# Patient Record
Sex: Male | Born: 1935 | Race: White | Hispanic: No | Marital: Married | Smoking: Former smoker
Health system: Southern US, Community
[De-identification: ages and names within clinical notes are randomized; demographics above are authoritative.]

## PROBLEM LIST (undated history)

## (undated) DIAGNOSIS — I639 Cerebral infarction, unspecified: Secondary | ICD-10-CM

## (undated) DIAGNOSIS — G300 Alzheimer's disease with early onset: Secondary | ICD-10-CM

## (undated) DIAGNOSIS — I10 Essential (primary) hypertension: Secondary | ICD-10-CM

## (undated) DIAGNOSIS — I2510 Atherosclerotic heart disease of native coronary artery without angina pectoris: Secondary | ICD-10-CM

## (undated) DIAGNOSIS — R972 Elevated prostate specific antigen [PSA]: Secondary | ICD-10-CM

## (undated) DIAGNOSIS — I4892 Unspecified atrial flutter: Secondary | ICD-10-CM

## (undated) DIAGNOSIS — F0280 Dementia in other diseases classified elsewhere without behavioral disturbance: Secondary | ICD-10-CM

## (undated) HISTORY — DX: Elevated prostate specific antigen (PSA): R97.20

## (undated) HISTORY — DX: Cerebral infarction, unspecified: I63.9

## (undated) HISTORY — PX: CORONARY ARTERY BYPASS GRAFT: SHX141

## (undated) HISTORY — DX: Essential (primary) hypertension: I10

## (undated) HISTORY — DX: Unspecified atrial flutter: I48.92

## (undated) HISTORY — DX: Atherosclerotic heart disease of native coronary artery without angina pectoris: I25.10

---

## 1988-03-05 HISTORY — PX: OTHER SURGICAL HISTORY: SHX169

## 2005-03-05 DIAGNOSIS — M109 Gout, unspecified: Secondary | ICD-10-CM | POA: Insufficient documentation

## 2008-03-01 DIAGNOSIS — I2510 Atherosclerotic heart disease of native coronary artery without angina pectoris: Secondary | ICD-10-CM | POA: Insufficient documentation

## 2008-03-01 HISTORY — DX: Atherosclerotic heart disease of native coronary artery without angina pectoris: I25.10

## 2008-03-30 DIAGNOSIS — R972 Elevated prostate specific antigen [PSA]: Secondary | ICD-10-CM

## 2008-03-30 HISTORY — DX: Elevated prostate specific antigen (PSA): R97.20

## 2009-10-31 ENCOUNTER — Ambulatory Visit: Payer: Self-pay | Admitting: Gastroenterology

## 2009-10-31 LAB — HM COLONOSCOPY

## 2013-05-18 ENCOUNTER — Emergency Department: Payer: Self-pay | Admitting: Emergency Medicine

## 2013-05-18 LAB — CBC WITH DIFFERENTIAL/PLATELET
BASOS ABS: 0.1 10*3/uL (ref 0.0–0.1)
Basophil %: 0.7 %
Eosinophil #: 0.1 10*3/uL (ref 0.0–0.7)
Eosinophil %: 0.8 %
HCT: 45 % (ref 40.0–52.0)
HGB: 14.7 g/dL (ref 13.0–18.0)
LYMPHS PCT: 12.6 %
Lymphocyte #: 1.4 10*3/uL (ref 1.0–3.6)
MCH: 29 pg (ref 26.0–34.0)
MCHC: 32.6 g/dL (ref 32.0–36.0)
MCV: 89 fL (ref 80–100)
MONO ABS: 0.7 x10 3/mm (ref 0.2–1.0)
Monocyte %: 6.7 %
NEUTROS PCT: 79.2 %
Neutrophil #: 8.6 10*3/uL — ABNORMAL HIGH (ref 1.4–6.5)
PLATELETS: 464 10*3/uL — AB (ref 150–440)
RBC: 5.05 10*6/uL (ref 4.40–5.90)
RDW: 14.1 % (ref 11.5–14.5)
WBC: 10.8 10*3/uL — AB (ref 3.8–10.6)

## 2013-05-18 LAB — COMPREHENSIVE METABOLIC PANEL
ALBUMIN: 3 g/dL — AB (ref 3.4–5.0)
ALT: 16 U/L (ref 12–78)
AST: 20 U/L (ref 15–37)
Alkaline Phosphatase: 145 U/L — ABNORMAL HIGH
Anion Gap: 2 — ABNORMAL LOW (ref 7–16)
BUN: 17 mg/dL (ref 7–18)
Bilirubin,Total: 0.4 mg/dL (ref 0.2–1.0)
CO2: 31 mmol/L (ref 21–32)
CREATININE: 1.05 mg/dL (ref 0.60–1.30)
Calcium, Total: 9.2 mg/dL (ref 8.5–10.1)
Chloride: 107 mmol/L (ref 98–107)
EGFR (Non-African Amer.): >60
GLUCOSE: 100 mg/dL — AB (ref 65–99)
Osmolality: 281 (ref 275–301)
POTASSIUM: 4.6 mmol/L (ref 3.5–5.1)
Sodium: 140 mmol/L (ref 136–145)
Total Protein: 7.5 g/dL (ref 6.4–8.2)

## 2013-10-05 ENCOUNTER — Observation Stay: Payer: Self-pay | Admitting: Internal Medicine

## 2013-10-05 LAB — CBC WITH DIFFERENTIAL/PLATELET
BASOS ABS: 0.1 10*3/uL (ref 0.0–0.1)
BASOS PCT: 0.6 %
Eosinophil #: 0 10*3/uL (ref 0.0–0.7)
Eosinophil %: 0.1 %
HCT: 51.4 % (ref 40.0–52.0)
HGB: 16.4 g/dL (ref 13.0–18.0)
LYMPHS ABS: 1.5 10*3/uL (ref 1.0–3.6)
LYMPHS PCT: 13.6 %
MCH: 28.8 pg (ref 26.0–34.0)
MCHC: 32 g/dL (ref 32.0–36.0)
MCV: 90 fL (ref 80–100)
MONOS PCT: 8.1 %
Monocyte #: 0.9 x10 3/mm (ref 0.2–1.0)
Neutrophil #: 8.4 10*3/uL — ABNORMAL HIGH (ref 1.4–6.5)
Neutrophil %: 77.6 %
Platelet: 245 10*3/uL (ref 150–440)
RBC: 5.71 10*6/uL (ref 4.40–5.90)
RDW: 14.8 % — ABNORMAL HIGH (ref 11.5–14.5)
WBC: 10.8 10*3/uL — ABNORMAL HIGH (ref 3.8–10.6)

## 2013-10-05 LAB — COMPREHENSIVE METABOLIC PANEL
ALK PHOS: 106 U/L
ALT: 23 U/L
Albumin: 3.1 g/dL — ABNORMAL LOW (ref 3.4–5.0)
Anion Gap: 11 (ref 7–16)
BILIRUBIN TOTAL: 0.9 mg/dL (ref 0.2–1.0)
BUN: 20 mg/dL — AB (ref 7–18)
CALCIUM: 8.8 mg/dL (ref 8.5–10.1)
Chloride: 102 mmol/L (ref 98–107)
Co2: 24 mmol/L (ref 21–32)
Creatinine: 1.13 mg/dL (ref 0.60–1.30)
EGFR (African American): >60
EGFR (Non-African Amer.): >60
Glucose: 185 mg/dL — ABNORMAL HIGH (ref 65–99)
Osmolality: 281 (ref 275–301)
POTASSIUM: 3.9 mmol/L (ref 3.5–5.1)
SGOT(AST): 16 U/L (ref 15–37)
Sodium: 137 mmol/L (ref 136–145)
TOTAL PROTEIN: 7.1 g/dL (ref 6.4–8.2)

## 2013-10-05 LAB — TSH: Thyroid Stimulating Horm: 2.39 u[IU]/mL

## 2013-10-05 LAB — TROPONIN I: Troponin-I: <0.02 ng/mL

## 2013-10-06 ENCOUNTER — Ambulatory Visit: Payer: Self-pay | Admitting: Neurology

## 2013-10-06 LAB — URINALYSIS, COMPLETE
Bilirubin,UR: NEGATIVE
Glucose,UR: 50 mg/dL (ref 0–75)
Granular Cast: 6
Hyaline Cast: 5
KETONE: NEGATIVE
Leukocyte Esterase: NEGATIVE
Nitrite: NEGATIVE
PH: 5 (ref 4.5–8.0)
Protein: >=500
Specific Gravity: 1.026 (ref 1.003–1.030)

## 2013-10-06 LAB — LIPID PANEL
CHOLESTEROL: 190 mg/dL (ref 0–200)
HDL: 48 mg/dL (ref 40–60)
LDL CHOLESTEROL, CALC: 125 mg/dL — AB (ref 0–100)
TRIGLYCERIDES: 85 mg/dL (ref 0–200)
VLDL Cholesterol, Calc: 17 mg/dL (ref 5–40)

## 2013-10-06 LAB — AMMONIA: Ammonia, Plasma: 25 mcmol/L (ref 11–32)

## 2013-10-07 DIAGNOSIS — I639 Cerebral infarction, unspecified: Secondary | ICD-10-CM

## 2013-10-07 HISTORY — DX: Cerebral infarction, unspecified: I63.9

## 2013-11-02 ENCOUNTER — Encounter: Payer: Self-pay | Admitting: Family Medicine

## 2013-11-03 ENCOUNTER — Encounter: Payer: Self-pay | Admitting: Family Medicine

## 2013-12-03 ENCOUNTER — Encounter: Payer: Self-pay | Admitting: Family Medicine

## 2014-02-04 LAB — BASIC METABOLIC PANEL
BUN: 28 mg/dL — AB (ref 4–21)
CREATININE: 1.2 mg/dL (ref <=1.3)
GLUCOSE: 93 mg/dL
POTASSIUM: 5 mmol/L (ref 3.4–5.3)
SODIUM: 145 mmol/L (ref 137–147)

## 2014-06-09 DIAGNOSIS — I4892 Unspecified atrial flutter: Secondary | ICD-10-CM | POA: Diagnosis not present

## 2014-06-09 DIAGNOSIS — E780 Pure hypercholesterolemia: Secondary | ICD-10-CM | POA: Diagnosis not present

## 2014-06-09 DIAGNOSIS — Z1389 Encounter for screening for other disorder: Secondary | ICD-10-CM | POA: Diagnosis not present

## 2014-06-09 DIAGNOSIS — J440 Chronic obstructive pulmonary disease with acute lower respiratory infection: Secondary | ICD-10-CM | POA: Diagnosis not present

## 2014-06-10 DIAGNOSIS — E780 Pure hypercholesterolemia: Secondary | ICD-10-CM | POA: Diagnosis not present

## 2014-06-10 LAB — HEPATIC FUNCTION PANEL: ALT: 17 U/L (ref 10–40)

## 2014-06-10 LAB — LIPID PANEL
Cholesterol: 135 mg/dL (ref 0–200)
HDL: 44 mg/dL (ref 35–70)
LDL Cholesterol: 73 mg/dL
Triglycerides: 89 mg/dL (ref 40–160)

## 2014-06-26 NOTE — Consult Note (Signed)
PATIENT NAME:  Ryan Villanueva, Ryan Villanueva MR#:  [id number] DATE OF BIRTH:  1930/05/05  DATE OF CONSULTATION:  10/06/2013  REFERRING PHYSICIAN:   CONSULTING PHYSICIAN:  Pauletta BrownsYuriy Glenette Bookwalter, MD  REASON FOR CONSULTATION: Speech abnormalities.   HISTORY OF PRESENT ILLNESS: This is an 81 year old gentleman with past medical history of hypertension, hyperlipidemia, coronary artery disease, history of CABG. apparently, as per family, admitted with speech disturbance. The patient's family has described the patient's speech as being gibberish.  At the time of admission, the patient did not have any complaints, stated he had right hand swelling. Denies any trauma. Currently states he is back to baseline. The patient's brother is at bedside and he believes patient's speech is at baseline.   REVIEW OF SYSTEMS:  Denies fever and chills. Denies any blurry vision, double vision. Denies tinnitus, ear pain. Denies chest pain, palpitations. Denies nausea, vomiting. Denies dysuria, hematuria. Denies, I am sure, thyroid problems. Denies easy bruising or bleeding. Positive for right hand swelling as above. Denies any paralysis. Questionable slurry speech.   PAST MEDICAL HISTORY: Hypertension, hyperlipidemia, and history of CABG,   SOCIAL HISTORY: Social remote tobacco use, positive for EtOH use.   FAMILY HISTORY: Positive history of strokes and cardiovascular disease.   ALLERGIES: NO KNOWN DRUG ALLERGIES.   LABORATORY DATA: Work-up has been reviewed. Imaging has been reviewed. The patient has remote infarcts in the left occipital and left thalamic regions that are chronic, likely contributing to his dysarthria that is at baseline, a slight right upper extremity weakness.   NEUROLOGIC EXAMINATION:  The patient's temperature is 98, pulse 57, respirations 18, blood pressure 172/87. The patient is awake to his name, tells me he is in the hospital, could not tell me the date and time. Facial sensation intact. Facial motor is intact. On  motor examination, slight weakness on the right upper and right lower extremity is likely from the chronic stroke. The patient's speech is dysarthric but, as per brother and the patient, that is his baseline. Coordination: Finger-to-nose intact, diminished on the right side. Sensation intact bilateral. Reflexes diminished.   IMPRESSION: An 81 year old male with hypertension, hyperlipidemia, and coronary artery disease as per family, presents with speech disturbances. Currently suspected that speech is at baseline on examination. Speaking to the patient, I believe there are a lot of confabulations that are present. Imaging has been reviewed.   PLAN: I would not recommend any further imaging at this point. His ultrasound on the MRI has been reviewed. There is a possibility of EtOH being component to his current problems because of his confabulations. He appears to be agitated, slightly tremulous. I agree with daily thiamine and folate, antiplatelet aspirin as the patient was not on it at home. Would be okay with discharging him from a neurological standpoint today. This case was discussed with the patient, patient's brother, and nursing staff.   Thank you, it was a pleasure seeing this patient.    ____________________________ Pauletta BrownsYuriy Labarron Durnin, MD yz:ts D: 10/06/2013 14:00:33 ET T: 10/06/2013 14:25:27 ET JOB#: [id number]  cc: Pauletta BrownsYuriy Tejah Brekke, MD, <Dictator> Pauletta BrownsYURIY Sharryn Belding MD ELECTRONICALLY SIGNED 10/06/2013 21:17

## 2014-06-26 NOTE — Discharge Summary (Signed)
PATIENT NAME:  Ryan Villanueva, Ryan Villanueva MR#:  [id number] DATE OF BIRTH:  Feb 14, 1931  DATE OF ADMISSION:  10/05/2013 DATE OF DISCHARGE:  10/06/2013  DISCHARGE DIAGNOSES:  1.  Vascular dementia.  2.  Alcohol abuse.  3.  Hypertension.  4.  Hyperlipidemia.  5.  Noncompliance.  6.  History of cerebrovascular accident.   DISCHARGE MEDICATIONS:  1.  Atorvastatin 20 mg oral daily.  2.  Aspirin 81 mg daily.  3.  Hydrochlorothiazide/lisinopril 25/20 one tablet daily.  4.  Thiamine 100 mg daily.  5.  Ibuprofen 600 mg oral 3 times a day.   IMAGING STUDIES:  1.  Include a CT scan of the head without contrast showed no acute abnormalities, mild-to-moderate atrophy and mild chronic small vessel white matter ischemic changes.  2.  Chest x-ray, portable, showed nothing acute, mild atelectasis in the left lower lobe.  3.  MRI of the brain without contrast showed mild global atrophy without hydrocephalus, tiny areas of blood breakdown products left occipital lobe and left thalamus consistent with old prior hemorrhagic ischemia.  4.  Mild-to-moderate severe small vessel ischemic changes.  5.  Carotid Doppler showed no significant stenosis.  6.  Echocardiogram showed EF of 50% to 55% with moderately dilated left ventricle and left atrium, nothing acute, no thrombus.   ADMITTING HISTORY AND PHYSICAL AND HOSPITAL COURSE: Please see detailed H and P dictated by Dr. Clint GuyHower. In brief, an 81 year old male patient with history of alcohol abuse and hypertension brought into the hospital complaining of slurred speech. The patient had the slow speech going on for 2 months. He was placed on telemetry floor, got an MRI of the brain which showed no acute strokes, did show old hemorrhagic infarcts. He also had carotid Doppler and echocardiogram which showed nothing acute. He was seen by neurology who thought the patient's symptoms were likely secondary to the alcohol and he was given high-dose thiamine 5 mg IV dose in the hospital. He  is being started on thiamine at home. He has been set up with home health PT, OT, and speech therapy. The patient does have progressively worsening vascular dementia.   Prior to discharge, the patient's motor strength is 5/5 in upper and lower extremities, does not have any slurred speech, has confabulation. He does have dementia.   DISCHARGE INSTRUCTIONS: Low-sodium, low-fat diet. Activity as tolerated. Follow up with primary care physician and neurology in a week.   ____________________________ Molinda BailiffSrikar R. Faren Florence, MD srs:tm D: 10/08/2013 12:46:49 ET T: 10/08/2013 14:47:14 ET JOB#: [id number]  cc: Wardell HeathSrikar R. Kaiyon Hynes, MD, <Dictator> Orie FishermanSRIKAR R Jaysten Essner MD ELECTRONICALLY SIGNED 10/19/2013 14:09

## 2014-06-26 NOTE — H&P (Signed)
PATIENT NAME:  Ryan Villanueva, Ryan Villanueva MR#:  [id number] DATE OF BIRTH:  May 10, 1930  DATE OF ADMISSION:  10/05/2013  REFERRING PHYSICIAN: Dr. Darnelle Catalan.  PRIMARY CARE PHYSICIAN: Dr. Sherrie Mustache.   CHIEF COMPLAINT: Speech problems.   HISTORY OF PRESENT ILLNESS: The patient is an 81 year old Caucasian gentleman with history of hypertension, hyperlipidemia, coronary artery disease status post CABG, as well as medical noncompliance, presenting with speech problems. Family noticed onset of problems at 7:00 p.m. on 10/05/2013. He had slurred speech as well as associated confusion and speaking gibberish. The patient denies any complaints; however, he is currently confused. His only complaint is of right hand swelling, though he has no further information about this. Denies any trauma, but states that he has swelling and redness of his right hand, and it is difficult to close his hand, though denies any further symptoms. Denies any actual pain in that extremity.   REVIEW OF SYSTEMS: Question the validity of these statements, as the patient is currently confused, however: CONSTITUTIONAL: Denies fevers, chills, weakness.  EYES: Denies blurry vision, double vision or eye pain.  EARS, NOSE, THROAT: Denies tinnitus, ear pain, hearing loss.  RESPIRATORY: Denies cough, wheeze, shortness of breath.  CARDIOVASCULAR: Denies chest pain, palpitations, edema. GASTROINTESTINAL: Denies nausea, vomiting, diarrhea, abdominal pain.  GENITOURINARY: Denies dysuria, hematuria.  ENDOCRINE: Denies nocturia or thyroid problems.  HEMATOLOGIC AND LYMPHATIC: Denies easy bruising or bleeding.  SKIN: Denies rashes or lesions.  MUSCULOSKELETAL: Positive for right hand swelling as described above. Otherwise, denies any neck pain, back pain, shoulder pain, knee pain, hip pain, or any further arthritic symptoms.  NEUROLOGIC: Denies any paralysis, paresthesias.  PSYCHIATRIC: Denies anxiety or depressive symptoms.  Otherwise, full review of systems  performed by me is negative; however, once again, the family describes slurred speech.   PAST MEDICAL HISTORY: Hypertension, hyperlipidemia, coronary artery disease status post CABG.   SOCIAL HISTORY: Remote tobacco use. Positive for alcohol use. Family states he is an every day alcohol user. Denies any drug use.   FAMILY HISTORY: Positive for cancers. However, denies any stroke or cardiovascular problems.   ALLERGIES: No known drug allergies.   HOME MEDICATIONS: He has an incomplete medication list. His wife did bring in two medication lists, though medication list is incomplete. He states that is not taking any of his medications, does have tramadol 50 mg p.o. q.8 hours as needed for pain listed, and varying blood pressure medications, though the family is not actually sure which ones he is taking.   PHYSICAL EXAMINATION: VITAL SIGNS: Temperature 98.5, heart rate 85, respirations 20, blood pressure 221/125, saturating 94% on room air.  GENERAL: Well-nourished, well-developed Caucasian gentleman, currently in no acute distress.  HEAD: Normocephalic, atraumatic.  EYES: Pupils equal, round, reactive to light. Extraocular movements intact. No scleral icterus.  MOUTH: Moist mucosal membrane. Dentition poor. No abscess noted.  EARS, NOSE, AND THROAT: Clear, without exudates. No external lesions.  NECK: Supple. No thyromegaly. No nodules. No JVD.  PULMONARY: Clear to auscultation bilaterally without wheezes, rubs or rhonchi. No use of accessory muscles. Good respiratory effort.  CHEST: Nontender to palpation.  CARDIOVASCULAR: S1, S2. Irregular rate, irregular rhythm. No murmurs, rubs, or gallops. No edema. Pedal pulses 2+ bilaterally.  GASTROINTESTINAL: Soft, nontender, nondistended. No masses. Good bowel sounds. No hepatosplenomegaly.  MUSCULOSKELETAL: Dorsum of right hand with erythema as well as edema, warm to touch. However, nontender to palpation. Otherwise, no further swelling, clubbing, or  edema. Range of motion full in all extremities.  NEUROLOGIC: Cranial nerves  II through XII intact. No gross focal neurological deficits. Strength 5/5 in all extremities, including proximal and distal flexion and extension. Sensation intact. Reflexes intact. Pronator drift within normal limits. However, speech is altered, as described below.  PSYCHIATRIC: Mood and affect blunted. The patient is awake, alert, oriented to self only. He is unaware of his current location as well as the date. His speech is somewhat garbled. However, this is compounded by he is hard of hearing. He currently has no slurred speech, though does have some word-finding difficulty. Insight and judgment appear to be poor.   LABORATORY DATA: EKG reveals atrial fibrillation with right bundle branch block. CT head performed, reveals no acute intracranial process. Chest x-ray performed, reveals no acute cardiopulmonary process. X-ray of the right wrist reveals old avulsion of the ulnar styloid with remodeling, no acute fractures noted. Sodium 137, potassium 3.9, chloride 102, bicarbonate 24, BUN 20, creatinine 1.13, glucose 185. LFTs: Albumin 3.1. Troponin 0.02. WBC 10.8, hemoglobin 16.4, platelets of 245.   ASSESSMENT AND PLAN: An 81 year old Caucasian gentleman with history of hyperlipidemia and coronary artery disease, presenting with speech problems.  1.  Cerebrovascular accident symptoms still present. Will initiate aspirin and statin therapy, place on telemetry, and check MRI, as well as carotid Dopplers and lipids. Do neurologic checks every 4 hours, allow permissive hypertension, treating only blood pressure greater than 220/120. At that time, hydralazine 10 mg intravenous.  2.  Hypertension. The patient is not sure of his medications, and once again, is not actually taking any of these medications. Will initiate as-needed hydralazine, as stated above, with permissive hypertension in the setting of possible stroke.  3.   Hyperlipidemia. Initiate statin therapy.  4.  Coronary artery disease, status post coronary artery bypass grafting. Initiate aspirin and statin therapy.  5.  Right hand pain. Provide supportive measures. There is no acute fracture on x-ray. Rest, ice, elevation.  6.  Alcohol abuse, heavy, per family. Will initiate CIWA protocol.  7.  Deep venous thrombosis prophylaxis with sequential compression devices.   CODE STATUS: The patient is full code.   TIME SPENT: 45 minutes.   ____________________________ Cletis Athensavid K. Renwick Asman, MD dkh:cg D: 10/05/2013 23:29:03 ET T: 10/06/2013 00:23:22 ET JOB#: [id number]  cc: Cletis Athensavid K. Amay Mijangos, MD, <Dictator> Ilai Hiller Synetta ShadowK Desirae Mancusi MD ELECTRONICALLY SIGNED 10/07/2013 0:17

## 2014-07-27 DIAGNOSIS — F0390 Unspecified dementia without behavioral disturbance: Secondary | ICD-10-CM | POA: Diagnosis not present

## 2014-07-27 DIAGNOSIS — I459 Conduction disorder, unspecified: Secondary | ICD-10-CM | POA: Diagnosis not present

## 2014-07-27 DIAGNOSIS — I4892 Unspecified atrial flutter: Secondary | ICD-10-CM | POA: Diagnosis not present

## 2014-07-27 DIAGNOSIS — R001 Bradycardia, unspecified: Secondary | ICD-10-CM | POA: Diagnosis not present

## 2014-11-04 DIAGNOSIS — I4892 Unspecified atrial flutter: Secondary | ICD-10-CM | POA: Insufficient documentation

## 2014-11-04 DIAGNOSIS — F0390 Unspecified dementia without behavioral disturbance: Secondary | ICD-10-CM

## 2014-11-04 DIAGNOSIS — R479 Unspecified speech disturbances: Secondary | ICD-10-CM | POA: Insufficient documentation

## 2014-11-04 DIAGNOSIS — I459 Conduction disorder, unspecified: Secondary | ICD-10-CM | POA: Insufficient documentation

## 2014-11-04 DIAGNOSIS — F0150 Vascular dementia without behavioral disturbance: Secondary | ICD-10-CM | POA: Insufficient documentation

## 2014-11-04 DIAGNOSIS — I639 Cerebral infarction, unspecified: Secondary | ICD-10-CM | POA: Insufficient documentation

## 2014-11-04 DIAGNOSIS — M25539 Pain in unspecified wrist: Secondary | ICD-10-CM | POA: Insufficient documentation

## 2014-11-04 DIAGNOSIS — R001 Bradycardia, unspecified: Secondary | ICD-10-CM | POA: Insufficient documentation

## 2014-11-10 ENCOUNTER — Encounter: Payer: Self-pay | Admitting: Family Medicine

## 2014-11-17 ENCOUNTER — Encounter: Payer: Self-pay | Admitting: Family Medicine

## 2014-11-25 ENCOUNTER — Encounter: Payer: Self-pay | Admitting: Family Medicine

## 2014-11-25 ENCOUNTER — Ambulatory Visit (INDEPENDENT_AMBULATORY_CARE_PROVIDER_SITE_OTHER): Payer: Commercial Managed Care - HMO | Admitting: Family Medicine

## 2014-11-25 VITALS — BP 140/64 | HR 41 | Temp 97.9°F | Resp 16 | Ht 68.0 in | Wt 188.0 lb

## 2014-11-25 DIAGNOSIS — I483 Typical atrial flutter: Secondary | ICD-10-CM

## 2014-11-25 DIAGNOSIS — F0390 Unspecified dementia without behavioral disturbance: Secondary | ICD-10-CM

## 2014-11-25 DIAGNOSIS — E7800 Pure hypercholesterolemia, unspecified: Secondary | ICD-10-CM

## 2014-11-25 DIAGNOSIS — F0150 Vascular dementia without behavioral disturbance: Secondary | ICD-10-CM | POA: Diagnosis not present

## 2014-11-25 DIAGNOSIS — I6990 Unspecified sequelae of unspecified cerebrovascular disease: Secondary | ICD-10-CM

## 2014-11-25 DIAGNOSIS — I2510 Atherosclerotic heart disease of native coronary artery without angina pectoris: Secondary | ICD-10-CM

## 2014-11-25 DIAGNOSIS — Z23 Encounter for immunization: Secondary | ICD-10-CM

## 2014-11-25 DIAGNOSIS — Z0000 Encounter for general adult medical examination without abnormal findings: Secondary | ICD-10-CM

## 2014-11-25 DIAGNOSIS — F0391 Unspecified dementia with behavioral disturbance: Secondary | ICD-10-CM | POA: Diagnosis not present

## 2014-11-25 DIAGNOSIS — Z951 Presence of aortocoronary bypass graft: Secondary | ICD-10-CM | POA: Insufficient documentation

## 2014-11-25 MED ORDER — RIVAROXABAN 20 MG PO TABS: 20.0000 mg | ORAL_TABLET | Freq: Every day | ORAL | Status: DC

## 2014-11-25 NOTE — Progress Notes (Signed)
Patient: Ryan Villanueva, Male    DOB: [birth year], 81 y.o.   MRN: [id number] Visit Date: 11/25/2014  Today's Provider: Mila Merry, MD   Chief Complaint  Patient presents with  . Medicare Wellness  . Hyperlipidemia  . Hypertension  . Atrial Flutter   Subjective:    Physical  Ryan Villanueva is a 81 y.o. male. He feels well. He reports exercising yes. He reports he is sleeping well.  -----------------------------------------------------------   Atrial Flutter Follow-up for atrial flutter from 06/09/2014. Was referred to Dr. Lady Gary for follow up. Since then prescribed started xarelto 15 mg qd and advised to stop taking daily aspirin. However he has no been taking Xarelto. He denies any chest pain, palpitations, or shortness of breath.    Hypertension, follow-up:  BP Readings from Last 3 Encounters:  11/25/14 140/64  07/27/14 118/58    He was last seen for hypertension 5 months ago.  BP at that visit was 148/54. Management since that visit includes none .He reports good compliance with treatment. He is not having side effects. none  He is exercising. He is adherent to low salt diet.   Outside blood pressures are not being checked.  ---------------------------------------------------------------------    Lipid/Cholesterol, Follow-up:   Last seen for this 5 months ago.  Management since that visit includes none.  Last Lipid Panel:    Component Value Date/Time   CHOL 135 06/10/2014   CHOL 190 10/06/2013 0429   TRIG 89 06/10/2014   TRIG 85 10/06/2013 0429   HDL 44 06/10/2014   HDL 48 10/06/2013 0429   VLDL 17 10/06/2013 0429   LDLCALC 73 06/10/2014   LDLCALC 125* 10/06/2013 0429    He reports good compliance with treatment. He is not having side effects. none  Wt Readings from Last 3 Encounters:  11/25/14 188 lb (85.276 kg)  07/27/14 187 lb (84.823 kg)    ----------------------------------------------------------------------   Dementia: He  continues donepezil prescribed by Dr. Sherryll Burger. He states he is tolerating medication well, but is unable to state if it has been beneficial. There are no family members present today to express their opinion.     Review of Systems  Constitutional: Negative.  Negative for fever, chills, appetite change and fatigue.  HENT: Negative.  Negative for congestion, ear pain, hearing loss, nosebleeds and trouble swallowing.   Eyes: Negative.  Negative for pain and visual disturbance.  Respiratory: Negative.  Negative for cough, chest tightness and shortness of breath.   Cardiovascular: Negative.  Negative for chest pain, palpitations and leg swelling.  Gastrointestinal: Negative.  Negative for nausea, vomiting, abdominal pain, diarrhea, constipation and blood in stool.  Endocrine: Negative.  Negative for polydipsia, polyphagia and polyuria.  Genitourinary: Negative.  Negative for dysuria and flank pain.  Musculoskeletal: Negative.  Negative for myalgias, back pain, joint swelling, arthralgias and neck stiffness.  Skin: Negative.  Negative for color change, rash and wound.  Allergic/Immunologic: Negative.   Neurological: Negative.  Negative for dizziness, tremors, seizures, speech difficulty, weakness, light-headedness and headaches.  Hematological: Negative.   Psychiatric/Behavioral: Positive for confusion. Negative for behavioral problems, sleep disturbance, dysphoric mood and decreased concentration. The patient is not nervous/anxious.     Social History   Social History  . Marital Status: Married    Spouse Name: N/A  . Number of Children: N/A  . Years of Education: N/A   Occupational History  . Not on file.   Social History Main Topics  . Smoking status: Former  Smoker  . Smokeless tobacco: Not on file     Comment: quit smoking in the 1990's uses smokeless tobacco  . Alcohol Use: No  . Drug Use: Not on file  . Sexual Activity: Not on file   Other Topics Concern  . Not on file   Social  History Narrative    Patient Active Problem List   Diagnosis Date Noted  . H/O coronary artery bypass surgery 11/25/2014  . Late effects of CVA (cerebrovascular accident) 11/25/2014  . Atrial flutter 11/04/2014  . Bradycardia 11/04/2014  . Cerebral infarct 11/04/2014  . 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 HB (heart block) 11/04/2014  . Speech disorder 11/04/2014  . CAD in native artery 03/01/2008  . Arthritis due to gout 03/05/2005  . Essential (primary) hypertension 03/05/1988  . Hypercholesteremia 03/05/1988    Past Surgical History  Procedure Laterality Date  . History of coronary artery bypass graft  1990    x3 Surgeon [facility]    His family history is not on file.    Previous Medications   AMLODIPINE (NORVASC) 10 MG TABLET    Take 1 tablet by mouth daily.   ATORVASTATIN (LIPITOR) 40 MG TABLET    Take 1 tablet by mouth daily.   DONEPEZIL (ARICEPT) 5 MG TABLET    Take 1 tablet by mouth at bedtime.   LISINOPRIL-HYDROCHLOROTHIAZIDE (PRINZIDE,ZESTORETIC) 20-25 MG PER TABLET    Take 1 tablet by mouth daily.   MULTIPLE VITAMIN PO    Take 1 tablet by mouth daily.   RIVAROXABAN (XARELTO) 20 MG TABS TABLET    Take 20 mg by mouth daily.   VITAMIN B-12 (CYANOCOBALAMIN) 1000 MCG TABLET    Take 1 tablet by mouth daily.    Patient Care Team: Malva Limes, MD as PCP - General (Family Medicine) Lonell Face, MD (Neurology) Dalia Heading, MD (Cardiology) Lonell Face, MD (Neurology)     Objective:   Vitals: BP 140/64 mmHg  Pulse 41  Temp(Src) 97.9 F (36.6 C) (Oral)  Resp 16  Ht  (1.727 m)  Wt 188 lb (85.276 kg)  BMI 28.59 kg/m2  SpO2 96%  Physical Exam   General Appearance:    Alert, cooperative, no distress, appears stated age  Head:    Normocephalic, without obvious abnormality, atraumatic  Eyes:    PERRL, conjunctiva/corneas clear, EOM's intact, fundi    benign, both eyes       Ears:    Normal TM's and external ear canals, both ears   Nose:   Nares normal, septum midline, mucosa normal, no drainage   or sinus tenderness  Throat:   Lips, mucosa, and tongue normal; teeth and gums normal  Neck:   Supple, symmetrical, trachea midline, no adenopathy;       thyroid:  No enlargement/tenderness/nodules; no carotid   bruit or JVD  Back:     Symmetric, no curvature, ROM normal, no CVA tenderness  Lungs:     Clear to auscultation bilaterally, respirations unlabored  Chest wall:    No tenderness or deformity  Heart:    Bradycardic, Regular rhythm, S1 and S2 normal,  rub   or gallop. II/VY systolic murmur  Abdomen:     Soft, non-tender, bowel sounds active all four quadrants,    no masses, no organomegaly  Genitalia:    deferred  Rectal:    deferred  Extremities:   Extremities normal, atraumatic, no cyanosis or edema  Pulses:   2+ and symmetric all  extremities  Skin:   Skin color, texture, turgor normal, no rashes or lesions  Lymph nodes:   Cervical, supraclavicular, and axillary nodes normal  Neurologic:   CNII-XII intact. Normal strength, sensation and reflexes      Throughout. Awake and alert. Oriented to person only.     Activities of Daily Living In your present state of health, do you have any difficulty performing the following activities: 11/25/2014  Hearing? Y  Vision? N  Difficulty concentrating or making decisions? N  Walking or climbing stairs? Y  Dressing or bathing? N  Doing errands, shopping? N    Fall Risk Assessment Fall Risk  11/25/2014  Falls in the past year? No     Depression Screen PHQ 2/9 Scores 11/25/2014  PHQ - 2 Score 0  PHQ- 9 Score 0    Cognitive Testing - 6-CIT  Correct? Score   What year is it? yes 4 0 or 4  What month is it? yes 4 0 or 3  Memorize:    Floyde Parkins,  44,  [address]  [place],      What time is it? (within 1 hour) yes 0 0 or 3  Count backwards from 20 yes 0 0, 2, or 4  Name the months of the year yes 4 0, 2, or 4  Repeat name & address above yes 10 0, 2, 4, 6, 8,  or 10       TOTAL SCORE  23/28   Interpretation:  Abnormal-    Normal (0-7) Abnormal (8-28)    Assessment & Plan:     Annual Wellness Visit  Reviewed patient's Family Medical History Reviewed and updated list of patient's medical providers Assessment of cognitive impairment was done Assessed patient's functional ability Established a written schedule for health screening services Health Risk Assessent Completed and Reviewed  Exercise Activities and Dietary recommendations Goals    None      Immunization History  Administered Date(s) Administered  . Pneumococcal Conjugate-13 10/14/2013  . Pneumococcal Polysaccharide-23 05/13/2002    Health Maintenance  Topic Date Due  . TETANUS/TDAP  08/04/1951  . ZOSTAVAX  08/03/1992  . INFLUENZA VACCINE  10/04/2014  . COLONOSCOPY  11/01/2019  . PNA vac Low Risk Adult  Completed      Discussed health benefits of physical activity, and encouraged him to engage in regular exercise appropriate for his age and condition.    ------------------------------------------------------------------------------------------------------------ 1. Routine adult health maintenance  - EKG 12-Lead  2. Typical atrial flutter Not compliant with Xarelto. Advised to stop aspirin and start Xarelto - rivaroxaban (XARELTO) 20 MG TABS tablet; Take 1 tablet (20 mg total) by mouth daily.  Dispense: 30 tablet; Refill: 5  3. Hypercholesteremia He is tolerating atorvastatin well with no adverse effects.    4. CAD in native artery Asymptomatic. Compliant with medication.  Continue aggressive risk factor modification.   5. Late effects of CVA (cerebrovascular accident) Stable  6. Need for influenza vaccination  - Flu vaccine HIGH DOSE PF  7. Mixed vascular and neurodegenerative dementia, without behavioral disturbance Continue donepezil

## 2014-11-25 NOTE — Patient Instructions (Signed)
Stop taking Aspirin as soon as you start taking Xarelto

## 2014-11-27 ENCOUNTER — Other Ambulatory Visit: Payer: Self-pay | Admitting: Family Medicine

## 2014-11-30 ENCOUNTER — Other Ambulatory Visit: Payer: Self-pay | Admitting: *Deleted

## 2014-11-30 DIAGNOSIS — I483 Typical atrial flutter: Secondary | ICD-10-CM

## 2014-11-30 MED ORDER — ATORVASTATIN CALCIUM 40 MG PO TABS: 40.0000 mg | ORAL_TABLET | Freq: Every day | ORAL | Status: DC

## 2014-11-30 MED ORDER — RIVAROXABAN 20 MG PO TABS
20.0000 mg | ORAL_TABLET | Freq: Every day | ORAL | Status: DC
Start: 2014-11-30 — End: 2014-12-15

## 2014-12-15 ENCOUNTER — Other Ambulatory Visit: Payer: Self-pay | Admitting: Family Medicine

## 2014-12-15 DIAGNOSIS — I483 Typical atrial flutter: Secondary | ICD-10-CM

## 2014-12-15 MED ORDER — RIVAROXABAN 20 MG PO TABS: 20.0000 mg | ORAL_TABLET | Freq: Every day | ORAL | Status: DC

## 2014-12-15 MED ORDER — ATORVASTATIN CALCIUM 40 MG PO TABS: 40.0000 mg | ORAL_TABLET | Freq: Every day | ORAL | Status: AC

## 2015-03-28 ENCOUNTER — Encounter: Payer: Self-pay | Admitting: Family Medicine

## 2015-03-28 ENCOUNTER — Ambulatory Visit (INDEPENDENT_AMBULATORY_CARE_PROVIDER_SITE_OTHER): Payer: Commercial Managed Care - HMO | Admitting: Family Medicine

## 2015-03-28 VITALS — BP 142/62 | HR 60 | Temp 97.9°F | Resp 16 | Ht 68.0 in | Wt 181.0 lb

## 2015-03-28 DIAGNOSIS — I2510 Atherosclerotic heart disease of native coronary artery without angina pectoris: Secondary | ICD-10-CM | POA: Diagnosis not present

## 2015-03-28 DIAGNOSIS — E7800 Pure hypercholesterolemia, unspecified: Secondary | ICD-10-CM

## 2015-03-28 DIAGNOSIS — E649 Sequelae of unspecified nutritional deficiency: Secondary | ICD-10-CM | POA: Diagnosis not present

## 2015-03-28 DIAGNOSIS — I10 Essential (primary) hypertension: Secondary | ICD-10-CM | POA: Diagnosis not present

## 2015-03-28 DIAGNOSIS — R5383 Other fatigue: Secondary | ICD-10-CM

## 2015-03-28 DIAGNOSIS — I483 Typical atrial flutter: Secondary | ICD-10-CM | POA: Diagnosis not present

## 2015-03-28 DIAGNOSIS — I6990 Unspecified sequelae of unspecified cerebrovascular disease: Secondary | ICD-10-CM | POA: Diagnosis not present

## 2015-03-28 DIAGNOSIS — D649 Anemia, unspecified: Secondary | ICD-10-CM | POA: Diagnosis not present

## 2015-03-28 NOTE — Progress Notes (Signed)
Patient ID: Ryan Villanueva, male   DOB: Aug 22, 1930, 82 y.o.   MRN: [id number]       Patient: Ryan Villanueva Male    DOB: 01-Dec-1930   82 y.o.   MRN: [id number] Visit Date: 03/28/2015  Today's Provider: Mila Merry, MD   Chief Complaint  Patient presents with  . Coronary Artery Disease  . Atrial Flutter  . Hyperlipidemia   Subjective:    HPI  Hypertension, follow-up:  BP Readings from Last 3 Encounters:  03/28/15 142/62  11/25/14 140/64  07/27/14 118/58    He was last seen for hypertension 4 months ago.  BP at that visit was 140/64. Management since that visit includes no changes. He reports good compliance with treatment. He is not having side effects.  He is not exercising. Outside blood pressures are not being checked. He is experiencing none.  Patient denies chest pain.   Weight trend: stable Wt Readings from Last 3 Encounters:  03/28/15 181 lb (82.101 kg)  11/25/14 188 lb (85.276 kg)  07/27/14 187 lb (84.823 kg)    Current diet: well balanced  Atrial Flutter-Follow up Patient also comes in today to follow up on Atrial Flutter. Since last visit, patient was advised to D/C Aspirin and start Xarelto . Patient reports that he is tolerating medication well with no side effects.   Coronary Artery Disease Last follow up was on 11/25/14 and no changes were made in medications.   Patient's wife states he sleep a lot. He gets up about 3 times each night to urinate, but otherwise sleeps well at night, and does not snore loudly or having any breathing abnormalities when he sleeps.      No Known Allergies Previous Medications   AMLODIPINE (NORVASC) 10 MG TABLET    TAKE 1 TABLET EVERY DAY   ATORVASTATIN (LIPITOR) 40 MG TABLET    Take 1 tablet (40 mg total) by mouth daily.   DONEPEZIL (ARICEPT) 5 MG TABLET    Take 1 tablet by mouth at bedtime.   LISINOPRIL-HYDROCHLOROTHIAZIDE (PRINZIDE,ZESTORETIC) 20-25 MG PER TABLET    TAKE 1 TABLET EVERY DAY   MULTIPLE VITAMIN PO     Take 1 tablet by mouth daily.   RIVAROXABAN (XARELTO) 20 MG TABS TABLET    Take 1 tablet (20 mg total) by mouth daily.   VITAMIN B-12 (CYANOCOBALAMIN) 1000 MCG TABLET    Take 1 tablet by mouth daily.    Review of Systems  Constitutional: Negative for fever, chills and appetite change.  Respiratory: Negative for chest tightness, shortness of breath and wheezing.   Cardiovascular: Negative for chest pain and palpitations.  Gastrointestinal: Negative for nausea, vomiting and abdominal pain.    Social History  Substance Use Topics  . Smoking status: Former Games developer  . Smokeless tobacco: Not on file     Comment: quit smoking in the 1990's uses smokeless tobacco  . Alcohol Use: No   Objective:   BP 142/62 mmHg  Pulse 60  Temp(Src) 97.9 F (36.6 C)  Resp 16  Ht  (1.727 m)  Wt 181 lb (82.101 kg)  BMI 27.53 kg/m2  Physical Exam   General Appearance:    Alert, cooperative, no distress  Eyes:    PERRL, conjunctiva/corneas clear, EOM's intact       Lungs:     Clear to auscultation bilaterally, respirations unlabored  Heart:    Regular rate and rhythm. III/VI systolic murmur.   Neurologic:   Awake, alert, oriented x 3. No apparent  focal neurological           defect.           Assessment & Plan:     1. CAD in native artery Asymptomatic. Compliant with medication.  Continue aggressive risk factor modification.    2. Essential (primary) hypertension Well controlled.  Continue current medications.    3. Late effects of CVA (cerebrovascular accident) Still some difficulties formulating sentences, but stable and communicates well.   4. Hypercholesteremia He is tolerating atorvastatin well with no adverse effects.   - Lipid panel  5. Other fatigue  - T4 AND TSH - CBC - Comprehensive metabolic panel  6. History of atrial flutter Continue Xarelto      Mila Merry, MD  [facility]

## 2015-03-29 ENCOUNTER — Telehealth: Payer: Self-pay

## 2015-03-29 LAB — COMPREHENSIVE METABOLIC PANEL
ALT: 12 IU/L (ref 0–44)
AST: 15 IU/L (ref 0–40)
Albumin/Globulin Ratio: 1.4 (ref 1.1–2.5)
Albumin: 4.1 g/dL (ref 3.5–4.7)
Alkaline Phosphatase: 189 IU/L — ABNORMAL HIGH (ref 39–117)
BUN/Creatinine Ratio: 19 (ref 10–22)
BUN: 38 mg/dL — ABNORMAL HIGH (ref 8–27)
Bilirubin Total: 0.4 mg/dL (ref 0.0–1.2)
CALCIUM: 9.5 mg/dL (ref 8.6–10.2)
CO2: 20 mmol/L (ref 18–29)
CREATININE: 2.02 mg/dL — AB (ref 0.76–1.27)
Chloride: 100 mmol/L (ref 96–106)
GFR, EST AFRICAN AMERICAN: 34 mL/min/{1.73_m2} — AB (ref >59)
GFR, EST NON AFRICAN AMERICAN: 30 mL/min/{1.73_m2} — AB (ref >59)
GLOBULIN, TOTAL: 2.9 g/dL (ref 1.5–4.5)
Glucose: 89 mg/dL (ref 65–99)
Potassium: 5.5 mmol/L — ABNORMAL HIGH (ref 3.5–5.2)
SODIUM: 140 mmol/L (ref 134–144)
TOTAL PROTEIN: 7 g/dL (ref 6.0–8.5)

## 2015-03-29 LAB — LIPID PANEL
CHOLESTEROL TOTAL: 159 mg/dL (ref 100–199)
Chol/HDL Ratio: 4.4 ratio units (ref 0.0–5.0)
HDL: 36 mg/dL — ABNORMAL LOW (ref >39)
LDL CALC: 100 mg/dL — AB (ref 0–99)
Triglycerides: 117 mg/dL (ref 0–149)
VLDL Cholesterol Cal: 23 mg/dL (ref 5–40)

## 2015-03-29 LAB — T4 AND TSH
T4, Total: 7.5 ug/dL (ref 4.5–12.0)
TSH: 2.77 u[IU]/mL (ref 0.450–4.500)

## 2015-03-29 LAB — CBC
Hematocrit: 30.3 % — ABNORMAL LOW (ref 37.5–51.0)
Hemoglobin: 9.3 g/dL — ABNORMAL LOW (ref 12.6–17.7)
MCH: 22.4 pg — AB (ref 26.6–33.0)
MCHC: 30.7 g/dL — AB (ref 31.5–35.7)
MCV: 73 fL — ABNORMAL LOW (ref 79–97)
PLATELETS: 601 10*3/uL — AB (ref 150–379)
RBC: 4.15 x10E6/uL (ref 4.14–5.80)
RDW: 16.2 % — AB (ref 12.3–15.4)
WBC: 10.4 10*3/uL (ref 3.4–10.8)

## 2015-03-29 NOTE — Telephone Encounter (Signed)
Called [facility] to add tests below. Will await results.   Notes Recorded by Malva Limes, MD on 03/29/2015 at 7:44 AM Is very anemic. Please have [facility] add ferritin, B12, folate, ITC and serum iron. Thanks.

## 2015-03-30 ENCOUNTER — Encounter: Payer: Self-pay | Admitting: Family Medicine

## 2015-03-30 DIAGNOSIS — D509 Iron deficiency anemia, unspecified: Secondary | ICD-10-CM | POA: Insufficient documentation

## 2015-03-30 DIAGNOSIS — N289 Disorder of kidney and ureter, unspecified: Secondary | ICD-10-CM | POA: Insufficient documentation

## 2015-03-31 LAB — FERRITIN: Ferritin: 17 ng/mL — ABNORMAL LOW (ref 30–400)

## 2015-03-31 LAB — SPECIMEN STATUS REPORT

## 2015-05-21 ENCOUNTER — Emergency Department
Admission: EM | Admit: 2015-05-21 | Discharge: 2015-05-22 | Payer: Commercial Managed Care - HMO | Attending: Emergency Medicine | Admitting: Emergency Medicine

## 2015-05-21 ENCOUNTER — Emergency Department: Payer: Commercial Managed Care - HMO

## 2015-05-21 DIAGNOSIS — W1830XA Fall on same level, unspecified, initial encounter: Secondary | ICD-10-CM | POA: Insufficient documentation

## 2015-05-21 DIAGNOSIS — Y999 Unspecified external cause status: Secondary | ICD-10-CM | POA: Diagnosis not present

## 2015-05-21 DIAGNOSIS — I517 Cardiomegaly: Secondary | ICD-10-CM | POA: Diagnosis not present

## 2015-05-21 DIAGNOSIS — I2510 Atherosclerotic heart disease of native coronary artery without angina pectoris: Secondary | ICD-10-CM | POA: Diagnosis not present

## 2015-05-21 DIAGNOSIS — M25559 Pain in unspecified hip: Secondary | ICD-10-CM | POA: Diagnosis not present

## 2015-05-21 DIAGNOSIS — F0280 Dementia in other diseases classified elsewhere without behavioral disturbance: Secondary | ICD-10-CM | POA: Insufficient documentation

## 2015-05-21 DIAGNOSIS — I639 Cerebral infarction, unspecified: Secondary | ICD-10-CM | POA: Insufficient documentation

## 2015-05-21 DIAGNOSIS — Y939 Activity, unspecified: Secondary | ICD-10-CM | POA: Insufficient documentation

## 2015-05-21 DIAGNOSIS — I10 Essential (primary) hypertension: Secondary | ICD-10-CM | POA: Diagnosis not present

## 2015-05-21 DIAGNOSIS — Z79899 Other long term (current) drug therapy: Secondary | ICD-10-CM | POA: Diagnosis not present

## 2015-05-21 DIAGNOSIS — I442 Atrioventricular block, complete: Secondary | ICD-10-CM | POA: Diagnosis not present

## 2015-05-21 DIAGNOSIS — R4182 Altered mental status, unspecified: Secondary | ICD-10-CM | POA: Diagnosis not present

## 2015-05-21 DIAGNOSIS — S0990XA Unspecified injury of head, initial encounter: Secondary | ICD-10-CM | POA: Diagnosis not present

## 2015-05-21 DIAGNOSIS — S199XXA Unspecified injury of neck, initial encounter: Secondary | ICD-10-CM | POA: Diagnosis not present

## 2015-05-21 DIAGNOSIS — G300 Alzheimer's disease with early onset: Secondary | ICD-10-CM | POA: Insufficient documentation

## 2015-05-21 DIAGNOSIS — Y92099 Unspecified place in other non-institutional residence as the place of occurrence of the external cause: Secondary | ICD-10-CM | POA: Diagnosis not present

## 2015-05-21 DIAGNOSIS — Z87891 Personal history of nicotine dependence: Secondary | ICD-10-CM | POA: Diagnosis not present

## 2015-05-21 DIAGNOSIS — W19XXXA Unspecified fall, initial encounter: Secondary | ICD-10-CM

## 2015-05-21 DIAGNOSIS — Z951 Presence of aortocoronary bypass graft: Secondary | ICD-10-CM | POA: Diagnosis not present

## 2015-05-21 DIAGNOSIS — S3993XA Unspecified injury of pelvis, initial encounter: Secondary | ICD-10-CM | POA: Diagnosis not present

## 2015-05-21 HISTORY — DX: Alzheimer's disease with early onset: G30.0

## 2015-05-21 HISTORY — DX: Dementia in other diseases classified elsewhere, unspecified severity, without behavioral disturbance, psychotic disturbance, mood disturbance, and anxiety: F02.80

## 2015-05-21 LAB — CBC WITH DIFFERENTIAL/PLATELET
Basophils Absolute: 0.1 10*3/uL (ref 0–0.1)
Basophils Relative: 1 %
EOS PCT: 0 %
Eosinophils Absolute: 0 10*3/uL (ref 0–0.7)
HEMATOCRIT: 35.5 % — AB (ref 40.0–52.0)
Hemoglobin: 11.2 g/dL — ABNORMAL LOW (ref 13.0–18.0)
LYMPHS ABS: 1.1 10*3/uL (ref 1.0–3.6)
LYMPHS PCT: 9 %
MCH: 24.7 pg — AB (ref 26.0–34.0)
MCHC: 31.5 g/dL — AB (ref 32.0–36.0)
MCV: 78.3 fL — AB (ref 80.0–100.0)
MONO ABS: 1.1 10*3/uL — AB (ref 0.2–1.0)
MONOS PCT: 9 %
NEUTROS ABS: 10.2 10*3/uL — AB (ref 1.4–6.5)
Neutrophils Relative %: 81 %
PLATELETS: 294 10*3/uL (ref 150–440)
RBC: 4.54 MIL/uL (ref 4.40–5.90)
RDW: 21.9 % — AB (ref 11.5–14.5)
WBC: 12.5 10*3/uL — ABNORMAL HIGH (ref 3.8–10.6)

## 2015-05-21 LAB — COMPREHENSIVE METABOLIC PANEL
ALT: 14 U/L — ABNORMAL LOW (ref 17–63)
AST: 42 U/L — ABNORMAL HIGH (ref 15–41)
Albumin: 3.3 g/dL — ABNORMAL LOW (ref 3.5–5.0)
Alkaline Phosphatase: 118 U/L (ref 38–126)
Anion gap: 7 (ref 5–15)
BILIRUBIN TOTAL: 0.8 mg/dL (ref 0.3–1.2)
BUN: 31 mg/dL — AB (ref 6–20)
CHLORIDE: 107 mmol/L (ref 101–111)
CO2: 23 mmol/L (ref 22–32)
Calcium: 8.8 mg/dL — ABNORMAL LOW (ref 8.9–10.3)
Creatinine, Ser: 1.63 mg/dL — ABNORMAL HIGH (ref 0.61–1.24)
GFR, EST AFRICAN AMERICAN: 44 mL/min — AB (ref >60)
GFR, EST NON AFRICAN AMERICAN: 38 mL/min — AB (ref >60)
Glucose, Bld: 126 mg/dL — ABNORMAL HIGH (ref 65–99)
POTASSIUM: 4.2 mmol/L (ref 3.5–5.1)
Sodium: 137 mmol/L (ref 135–145)
TOTAL PROTEIN: 6.8 g/dL (ref 6.5–8.1)

## 2015-05-21 LAB — URINALYSIS COMPLETE WITH MICROSCOPIC (ARMC ONLY)
BILIRUBIN URINE: NEGATIVE
Bacteria, UA: NONE SEEN
GLUCOSE, UA: NEGATIVE mg/dL
KETONES UR: NEGATIVE mg/dL
Leukocytes, UA: NEGATIVE
NITRITE: NEGATIVE
PH: 5 (ref 5.0–8.0)
Protein, ur: >500 mg/dL — AB
Specific Gravity, Urine: 1.018 (ref 1.005–1.030)
Squamous Epithelial / LPF: NONE SEEN

## 2015-05-21 LAB — TSH: TSH: 2.684 u[IU]/mL (ref 0.350–4.500)

## 2015-05-21 LAB — TROPONIN I: TROPONIN I: 0.18 ng/mL — AB (ref <0.031)

## 2015-05-21 MED ORDER — ASPIRIN 81 MG PO CHEW
324.0000 mg | CHEWABLE_TABLET | Freq: Once | ORAL | Status: AC
  Administered 2015-05-21: 324 mg via ORAL
  Filled 2015-05-21: qty 4

## 2015-05-21 MED ORDER — ASPIRIN 81 MG PO CHEW
CHEWABLE_TABLET | ORAL | Status: AC
  Filled 2015-05-21: qty 1

## 2015-05-21 NOTE — ED Provider Notes (Signed)
[facility] Emergency Department Provider Note  ____________________________________________  Time seen: Seen upon arrival to the emergency department  I have reviewed the triage vital signs and the nursing notes.   HISTORY  Chief Complaint Fall    HPI BUCK MCAFFEE is a 82 y.o. male with a history of coronary artery disease and dementia who is presenting to the emergency department after a fall this morning. The family was concerned because after he fell he was not able to get back up. The fall happened about 8 or 9 AM. They said the patient is acting at his baseline mental status. He does have a history of dementia. He has Xarelto listed on his home medication list. Claudie Fisherman denies any pain. Is oriented to both his name and place but not to his birthday but the family says that seems to be his baseline. EMS reported that he lost balance bladder continence prior to arrival. They also reported that he had a heart rate of 40 and was given atropine and heart rate improved to the 50s.   Past Medical History  Diagnosis Date  . CAD in native artery 03/01/2008    h/o 3V CABG 1990 at [facility]  . Abnormal prostate specific antigen 03/30/2008    Normal prostate biopsy Dr. Jamelle Rushing 02/16/2013. Chronic and acutely inflamed prostate tissue.    Marland Kitchen Cerebral infarct (HCC) 10/07/2013    dysarthria, arm weakness  . Hypertension   . Early onset Alzheimer's dementia     Patient Active Problem List   Diagnosis Date Noted  . Iron deficiency anemia 03/30/2015  . Acute renal insufficiency 03/30/2015  . H/O coronary artery bypass surgery 11/25/2014  . Late effects of CVA (cerebrovascular accident) 11/25/2014  . Atrial flutter (HCC) 11/04/2014  . Bradycardia 11/04/2014  . 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 HB (heart block) 11/04/2014  . Speech disorder 11/04/2014  . CAD in native artery 03/01/2008  . Arthritis due to gout 03/05/2005  . Essential (primary)  hypertension 03/05/1988  . Hypercholesteremia 03/05/1988    Past Surgical History  Procedure Laterality Date  . History of coronary artery bypass graft  1990    x3 [facility]    Current Outpatient Rx  Name  Route  Sig  Dispense  Refill  . amLODipine (NORVASC) 10 MG tablet      TAKE 1 TABLET EVERY DAY   90 tablet   3   . atorvastatin (LIPITOR) 40 MG tablet   Oral   Take 1 tablet (40 mg total) by mouth daily.   90 tablet   4   . donepezil (ARICEPT) 5 MG tablet   Oral   Take 1 tablet by mouth at bedtime.         Marland Kitchen lisinopril-hydrochlorothiazide (PRINZIDE,ZESTORETIC) 20-25 MG per tablet      TAKE 1 TABLET EVERY DAY   90 tablet   3   . MULTIPLE VITAMIN PO   Oral   Take 1 tablet by mouth daily.         . rivaroxaban (XARELTO) 20 MG TABS tablet   Oral   Take 1 tablet (20 mg total) by mouth daily.   90 tablet   4   . vitamin B-12 (CYANOCOBALAMIN) 1000 MCG tablet   Oral   Take 1 tablet by mouth daily.           Allergies Review of patient's allergies indicates no known allergies.  No family history on file.  Social History Social History  Substance Use Topics  . Smoking status: Former Games developer  . Smokeless tobacco: None     Comment: quit smoking in the 1990's uses smokeless tobacco  . Alcohol Use: No    Review of Systems Constitutional: No fever/chills Eyes: No visual changes. ENT: No sore throat. Cardiovascular: Denies chest pain. Respiratory: Denies shortness of breath. Gastrointestinal: No abdominal pain.  No nausea, no vomiting.  No diarrhea.  No constipation. Genitourinary: Negative for dysuria. Musculoskeletal: Negative for back pain. Skin: Negative for rash. Neurological: Negative for headaches, focal weakness or numbness.  10-point ROS otherwise negative.  ____________________________________________   PHYSICAL EXAM:  VITAL SIGNS: ED Triage Vitals  Enc Vitals Group     BP 05/21/15 1853 138/59 mmHg     Pulse Rate 05/21/15 1853  55     Resp 05/21/15 1853 16     Temp --      Temp src --      SpO2 05/21/15 1853 94 %     Weight 05/21/15 1853 208 lb 8 oz (94.575 kg)     Height 05/21/15 1853 5\' 6"  (1.676 m)     Head Cir --      Peak Flow --      Pain Score --      Pain Loc --      Pain Edu? --      Excl. in GC? --     Constitutional: Alert and oriented. Well appearing and in no acute distress. Eyes: Conjunctivae are normal. PERRL. EOMI. Head: Atraumatic. Nose: No congestion/rhinnorhea. Mouth/Throat: Mucous membranes are moist.   Neck: No stridor.  Nontender. Ranges freely. Cardiovascular: Bradycardic, regular rhythm. Grossly normal heart sounds.  Good peripheral circulation. Respiratory: Normal respiratory effort.  No retractions. Lungs CTAB. Gastrointestinal: Soft and nontender. No distention. No abdominal bruits. No CVA tenderness. Musculoskeletal: No lower extremity tenderness nor edema.  No joint effusions. Neurologic:  Normal speech and language. Weak 4 out of 5 strength to the right lower extremity. Sensation is intact. Dorsalis pedis pulse intact to the right lower extremity. Out of 5 strength in bilateral upper extremity is. No facial droop. No garbled speech. Skin:  Skin is warm, dry and intact. No rash noted. Psychiatric: Mood and affect are normal. Speech and behavior are normal.  ____________________________________________   LABS (all labs ordered are listed, but only abnormal results are displayed)  Labs Reviewed  TROPONIN I - Abnormal; Notable for the following:    Troponin I 0.18 (*)    All other components within normal limits  CBC WITH DIFFERENTIAL/PLATELET - Abnormal; Notable for the following:    WBC 12.5 (*)    Hemoglobin 11.2 (*)    HCT 35.5 (*)    MCV 78.3 (*)    MCH 24.7 (*)    MCHC 31.5 (*)    RDW 21.9 (*)    Neutro Abs 10.2 (*)    Monocytes Absolute 1.1 (*)    All other components within normal limits  COMPREHENSIVE METABOLIC PANEL - Abnormal; Notable for the following:     Glucose, Bld 126 (*)    BUN 31 (*)    Creatinine, Ser 1.63 (*)    Calcium 8.8 (*)    Albumin 3.3 (*)    AST 42 (*)    ALT 14 (*)    GFR calc non Af Amer 38 (*)    GFR calc Af Amer 44 (*)    All other components within normal limits  TSH  URINALYSIS COMPLETEWITH MICROSCOPIC ([facility] ONLY)  ____________________________________________  EKG  ED ECG REPORT I, Arelia LongestSchaevitz,  Yasheka Fossett M, the attending physician, personally viewed and interpreted this ECG.   Date: 05/21/2015  EKG Time: 1849  Rate: 55  Rhythm: sinus bradycardia  Axis: Normal  Intervals:right bundle branch block  ST&T Change: No ST segment elevation or depression. T-wave inversion in aVF as well as lead 3 which appear unchanged from EKG of 11/25/2014.  ED ECG REPORT I, Arelia LongestSchaevitz,  Jodye Scali M, the attending physician, personally viewed and interpreted this ECG.   Date: 05/21/2015  EKG Time: 1950  Rate: 45  Rhythm: Complete heart block  Axis: Normal  Intervals: Right bundle branch block  ST&T Change: No ST segment elevation or depression. No abnormal T-wave inversion.   ____________________________________________  RADIOLOGY  CT Head Wo Contrast (Final result) Result time: 05/21/15 19:23:50   Final result by Rad Results In Interface (05/21/15 19:23:50)   Narrative:   CLINICAL DATA: Status post fall, with decreased level of consciousness. Bradycardia. Concern for cervical spine injury. Initial encounter.  EXAM: CT HEAD WITHOUT CONTRAST  CT CERVICAL SPINE WITHOUT CONTRAST  TECHNIQUE: Multidetector CT imaging of the head and cervical spine was performed following the standard protocol without intravenous contrast. Multiplanar CT image reconstructions of the cervical spine were also generated.  COMPARISON: CT of the head performed 10/05/2013, and MRI of the brain performed 10/06/2013  FINDINGS: CT HEAD FINDINGS  There is no evidence of acute infarction, mass lesion, or intra- or extra-axial  hemorrhage on CT.  Prominence of the ventricles and sulci reflects mild to moderate cortical volume loss. Mild cerebellar atrophy is noted. Scattered periventricular and subcortical white matter change likely reflects small vessel ischemic microangiopathy. A chronic lacunar infarct is noted at the right basal ganglia.  The brainstem and fourth ventricle are within normal limits. The cerebral hemispheres demonstrate grossly normal gray-white differentiation. No mass effect or midline shift is seen.  There is no evidence of fracture; visualized osseous structures are unremarkable in appearance. The orbits are within normal limits. The paranasal sinuses and mastoid air cells are well-aerated. No significant soft tissue abnormalities are seen.  CT CERVICAL SPINE FINDINGS  There is no evidence of acute fracture or subluxation. There is chronic osseous fusion at C6-C7. Multilevel disc space narrowing is noted along the lower cervical spine, with scattered anterior and posterior disc osteophyte complexes. There is mild grade 1 anterolisthesis of C 7 on T1, reflecting underlying facet disease. Prevertebral soft tissues are within normal limits.  The thyroid gland is unremarkable in appearance. The visualized lung apices are clear. Mild calcification is noted at the carotid bifurcations bilaterally.  IMPRESSION: 1. No evidence of traumatic intracranial injury or fracture. 2. No evidence of acute fracture or subluxation along the cervical spine. 3. Mild to moderate cortical volume loss and scattered small vessel ischemic microangiopathy. 4. Chronic lacunar infarct at the right basal ganglia. 5. Mild diffuse degenerative change along the lower cervical spine, with chronic osseous fusion at C6-C7. 6. Mild calcification at the carotid bifurcations bilaterally. Carotid ultrasound would be helpful for further evaluation, when and as deemed clinically appropriate.   Electronically  Signed By: Roanna RaiderJeffery Chang M.D. On: 05/21/2015 19:23          CT Cervical Spine Wo Contrast (Final result) Result time: 05/21/15 19:23:50   Final result by Rad Results In Interface (05/21/15 19:23:50)   Narrative:   CLINICAL DATA: Status post fall, with decreased level of consciousness. Bradycardia. Concern for cervical spine injury. Initial encounter.  EXAM: CT HEAD  WITHOUT CONTRAST  CT CERVICAL SPINE WITHOUT CONTRAST  TECHNIQUE: Multidetector CT imaging of the head and cervical spine was performed following the standard protocol without intravenous contrast. Multiplanar CT image reconstructions of the cervical spine were also generated.  COMPARISON: CT of the head performed 10/05/2013, and MRI of the brain performed 10/06/2013  FINDINGS: CT HEAD FINDINGS  There is no evidence of acute infarction, mass lesion, or intra- or extra-axial hemorrhage on CT.  Prominence of the ventricles and sulci reflects mild to moderate cortical volume loss. Mild cerebellar atrophy is noted. Scattered periventricular and subcortical white matter change likely reflects small vessel ischemic microangiopathy. A chronic lacunar infarct is noted at the right basal ganglia.  The brainstem and fourth ventricle are within normal limits. The cerebral hemispheres demonstrate grossly normal gray-white differentiation. No mass effect or midline shift is seen.  There is no evidence of fracture; visualized osseous structures are unremarkable in appearance. The orbits are within normal limits. The paranasal sinuses and mastoid air cells are well-aerated. No significant soft tissue abnormalities are seen.  CT CERVICAL SPINE FINDINGS  There is no evidence of acute fracture or subluxation. There is chronic osseous fusion at C6-C7. Multilevel disc space narrowing is noted along the lower cervical spine, with scattered anterior and posterior disc osteophyte complexes. There is mild grade  1 anterolisthesis of C 7 on T1, reflecting underlying facet disease. Prevertebral soft tissues are within normal limits.  The thyroid gland is unremarkable in appearance. The visualized lung apices are clear. Mild calcification is noted at the carotid bifurcations bilaterally.  IMPRESSION: 1. No evidence of traumatic intracranial injury or fracture. 2. No evidence of acute fracture or subluxation along the cervical spine. 3. Mild to moderate cortical volume loss and scattered small vessel ischemic microangiopathy. 4. Chronic lacunar infarct at the right basal ganglia. 5. Mild diffuse degenerative change along the lower cervical spine, with chronic osseous fusion at C6-C7. 6. Mild calcification at the carotid bifurcations bilaterally. Carotid ultrasound would be helpful for further evaluation, when and as deemed clinically appropriate.   Electronically Signed By: Roanna Raider M.D. On: 05/21/2015 19:23          DG Pelvis 1-2 Views (Final result) Result time: 05/21/15 19:13:58   Final result by Rad Results In Interface (05/21/15 19:13:58)   Narrative:   CLINICAL DATA: Recent fall with pain, initial encounter  EXAM: PELVIS - 1-2 VIEW  COMPARISON: None.  FINDINGS: There is no evidence of pelvic fracture or diastasis. No pelvic bone lesions are seen.  IMPRESSION: No acute abnormality noted.   Electronically Signed By: [name] On: 05/21/2015 19:13          DG Chest 1 View (Final result) Result time: 05/21/15 19:14:36   Final result by Rad Results In Interface (05/21/15 19:14:36)   Narrative:   CLINICAL DATA: Altered mental status ; per family pt fell this AM; pt states no pain; hx cad  EXAM: CHEST 1 VIEW  COMPARISON: 10/05/2013  FINDINGS: Status post median sternotomy and CABG. The heart is enlarged. Elevation of the left hemidiaphragm is stable. There are no focal consolidations or pleural effusions. There is  perihilar peribronchial thickening. Possible minimally displaced fracture of the right lateral ninth rib.  IMPRESSION: 1. Stable cardiomegaly. 2. No evidence for acute pulmonary abnormality. 3. Possible fracture of the right ninth rib. No pneumothorax. Recommend correlation with physical exam.   Electronically Signed By: [name] On: 05/21/2015 19:14       ____________________________________________   PROCEDURES  CRITICAL CARE Performed by: Arelia Longest   Total critical care time: 35 minutes  Critical care time was exclusive of separately billable procedures and treating other patients.  Critical care was necessary to treat or prevent imminent or life-threatening deterioration.  Critical care was time spent personally by me on the following activities: development of treatment plan with patient and/or surrogate as well as nursing, discussions with consultants, evaluation of patient's response to treatment, examination of patient, obtaining history from patient or surrogate, ordering and performing treatments and interventions, ordering and review of laboratory studies, ordering and review of radiographic studies, pulse oximetry and re-evaluation of patient's condition.  ____________________________________________   INITIAL IMPRESSION / ASSESSMENT AND PLAN / ED COURSE  Pertinent labs & imaging results that were available during my care of the patient were reviewed by me and considered in my medical decision making (see chart for details).  ----------------------------------------- 8:10 PM on 05/21/2015 -----------------------------------------  EKG repeated because monitor showing heart block. Confirmed on EKG. Discussed case with Dr. Johney Frame of cardiology from [facility]. Recommends transfer to [facility] for pacer placement. Does not recommend heparin at this time because patient pain-free. Thinks heart block not related to an acute  event.  Likely syncopal event that led to the fall. Discussed transfer to [facility] the patient and the family and they are agreeable. Unclear if the patient is still on rivaroxaban. The family is unsure.  ----------------------------------------- 8:25 PM on 05/21/2015 -----------------------------------------  Discussed case with Dr. Tarri Glenn of cardiology who accepts the patient to [facility].   ____________________________________________   FINAL CLINICAL IMPRESSION(S) / ED DIAGNOSES  Fall. Complete heart block.    Myrna Blazer, MD 05/21/15 2026

## 2015-05-21 NOTE — ED Notes (Signed)
Pt came to ED via EMS. Pt fell this morning at 0800. Pt reports no pain. Since the fall, pts wife reports he has had a decreased LOC. EMS reports pts HR was 40. Given atropine by EMS and pts heart rate was in the 60s.

## 2015-05-22 ENCOUNTER — Encounter (HOSPITAL_COMMUNITY): Payer: Self-pay | Admitting: *Deleted

## 2015-05-22 ENCOUNTER — Inpatient Hospital Stay (HOSPITAL_COMMUNITY): Payer: Commercial Managed Care - HMO

## 2015-05-22 ENCOUNTER — Inpatient Hospital Stay (HOSPITAL_COMMUNITY)
Admission: AD | Admit: 2015-05-22 | Discharge: 2015-05-25 | DRG: 309 | Disposition: A | Discharge status: Skilled Nursing Facility | Payer: Commercial Managed Care - HMO | Source: Other Acute Inpatient Hospital | Attending: Internal Medicine | Admitting: Internal Medicine

## 2015-05-22 DIAGNOSIS — I10 Essential (primary) hypertension: Secondary | ICD-10-CM | POA: Diagnosis present

## 2015-05-22 DIAGNOSIS — I484 Atypical atrial flutter: Secondary | ICD-10-CM

## 2015-05-22 DIAGNOSIS — F0280 Dementia in other diseases classified elsewhere without behavioral disturbance: Secondary | ICD-10-CM | POA: Diagnosis present

## 2015-05-22 DIAGNOSIS — F1021 Alcohol dependence, in remission: Secondary | ICD-10-CM | POA: Diagnosis not present

## 2015-05-22 DIAGNOSIS — M25561 Pain in right knee: Secondary | ICD-10-CM | POA: Diagnosis not present

## 2015-05-22 DIAGNOSIS — M25461 Effusion, right knee: Secondary | ICD-10-CM

## 2015-05-22 DIAGNOSIS — F0150 Vascular dementia without behavioral disturbance: Secondary | ICD-10-CM | POA: Diagnosis not present

## 2015-05-22 DIAGNOSIS — G300 Alzheimer's disease with early onset: Secondary | ICD-10-CM | POA: Diagnosis present

## 2015-05-22 DIAGNOSIS — I4430 Unspecified atrioventricular block: Secondary | ICD-10-CM | POA: Diagnosis not present

## 2015-05-22 DIAGNOSIS — Z9181 History of falling: Secondary | ICD-10-CM | POA: Diagnosis not present

## 2015-05-22 DIAGNOSIS — Z87891 Personal history of nicotine dependence: Secondary | ICD-10-CM | POA: Diagnosis not present

## 2015-05-22 DIAGNOSIS — Z7901 Long term (current) use of anticoagulants: Secondary | ICD-10-CM | POA: Diagnosis not present

## 2015-05-22 DIAGNOSIS — Z951 Presence of aortocoronary bypass graft: Secondary | ICD-10-CM

## 2015-05-22 DIAGNOSIS — Z5309 Procedure and treatment not carried out because of other contraindication: Secondary | ICD-10-CM | POA: Diagnosis not present

## 2015-05-22 DIAGNOSIS — I248 Other forms of acute ischemic heart disease: Secondary | ICD-10-CM | POA: Diagnosis present

## 2015-05-22 DIAGNOSIS — I442 Atrioventricular block, complete: Principal | ICD-10-CM | POA: Diagnosis present

## 2015-05-22 DIAGNOSIS — R001 Bradycardia, unspecified: Secondary | ICD-10-CM | POA: Diagnosis not present

## 2015-05-22 DIAGNOSIS — R531 Weakness: Secondary | ICD-10-CM | POA: Diagnosis not present

## 2015-05-22 DIAGNOSIS — G9340 Encephalopathy, unspecified: Secondary | ICD-10-CM | POA: Diagnosis not present

## 2015-05-22 DIAGNOSIS — I4892 Unspecified atrial flutter: Secondary | ICD-10-CM | POA: Diagnosis not present

## 2015-05-22 DIAGNOSIS — I2510 Atherosclerotic heart disease of native coronary artery without angina pectoris: Secondary | ICD-10-CM | POA: Diagnosis present

## 2015-05-22 DIAGNOSIS — I69398 Other sequelae of cerebral infarction: Secondary | ICD-10-CM | POA: Diagnosis not present

## 2015-05-22 DIAGNOSIS — Z8673 Personal history of transient ischemic attack (TIA), and cerebral infarction without residual deficits: Secondary | ICD-10-CM | POA: Diagnosis not present

## 2015-05-22 DIAGNOSIS — I519 Heart disease, unspecified: Secondary | ICD-10-CM | POA: Diagnosis not present

## 2015-05-22 DIAGNOSIS — M25569 Pain in unspecified knee: Secondary | ICD-10-CM | POA: Diagnosis not present

## 2015-05-22 DIAGNOSIS — M6281 Muscle weakness (generalized): Secondary | ICD-10-CM | POA: Diagnosis not present

## 2015-05-22 LAB — BASIC METABOLIC PANEL
Anion gap: 11 (ref 5–15)
BUN: 30 mg/dL — AB (ref 6–20)
CHLORIDE: 106 mmol/L (ref 101–111)
CO2: 23 mmol/L (ref 22–32)
CREATININE: 1.58 mg/dL — AB (ref 0.61–1.24)
Calcium: 8.6 mg/dL — ABNORMAL LOW (ref 8.9–10.3)
GFR calc Af Amer: 45 mL/min — ABNORMAL LOW (ref >60)
GFR calc non Af Amer: 39 mL/min — ABNORMAL LOW (ref >60)
GLUCOSE: 98 mg/dL (ref 65–99)
Potassium: 4 mmol/L (ref 3.5–5.1)
Sodium: 140 mmol/L (ref 135–145)

## 2015-05-22 LAB — CBC
HEMATOCRIT: 37.8 % — AB (ref 39.0–52.0)
HEMOGLOBIN: 11.6 g/dL — AB (ref 13.0–17.0)
MCH: 24.4 pg — AB (ref 26.0–34.0)
MCHC: 30.7 g/dL (ref 30.0–36.0)
MCV: 79.4 fL (ref 78.0–100.0)
Platelets: 316 10*3/uL (ref 150–400)
RBC: 4.76 MIL/uL (ref 4.22–5.81)
RDW: 20 % — ABNORMAL HIGH (ref 11.5–15.5)
WBC: 10 10*3/uL (ref 4.0–10.5)

## 2015-05-22 LAB — TROPONIN I
Troponin I: 0.44 ng/mL — ABNORMAL HIGH (ref <0.031)
Troponin I: 0.4 ng/mL — ABNORMAL HIGH (ref <0.031)
Troponin I: 0.27 ng/mL — ABNORMAL HIGH (ref <0.031)

## 2015-05-22 LAB — ECHOCARDIOGRAM COMPLETE
Height: 67 in
WEIGHTICAEL: 2574.4 [oz_av]

## 2015-05-22 LAB — MRSA PCR SCREENING: MRSA BY PCR: NEGATIVE

## 2015-05-22 LAB — BRAIN NATRIURETIC PEPTIDE: B Natriuretic Peptide: 1377.1 pg/mL — ABNORMAL HIGH (ref 0.0–100.0)

## 2015-05-22 MED ORDER — ACETAMINOPHEN 325 MG PO TABS: 650.0000 mg | ORAL_TABLET | ORAL | Status: DC | PRN

## 2015-05-22 MED ORDER — AMLODIPINE BESYLATE 5 MG PO TABS
5.0000 mg | ORAL_TABLET | Freq: Every day | ORAL | Status: DC
  Administered 2015-05-23 – 2015-05-25 (×3): 5 mg via ORAL
  Filled 2015-05-22 (×3): qty 1

## 2015-05-22 MED ORDER — RIVAROXABAN 15 MG PO TABS
15.0000 mg | ORAL_TABLET | Freq: Once | ORAL | Status: AC
  Administered 2015-05-22: 15 mg via ORAL
  Filled 2015-05-22: qty 1

## 2015-05-22 MED ORDER — HYDROCHLOROTHIAZIDE 25 MG PO TABS
25.0000 mg | ORAL_TABLET | Freq: Every day | ORAL | Status: DC
  Administered 2015-05-22: 25 mg via ORAL
  Filled 2015-05-22: qty 1

## 2015-05-22 MED ORDER — LISINOPRIL 20 MG PO TABS
20.0000 mg | ORAL_TABLET | Freq: Every day | ORAL | Status: DC
  Administered 2015-05-22: 20 mg via ORAL
  Filled 2015-05-22: qty 1

## 2015-05-22 MED ORDER — LISINOPRIL-HYDROCHLOROTHIAZIDE 20-25 MG PO TABS: 1.0000 | ORAL_TABLET | Freq: Every day | ORAL | Status: DC

## 2015-05-22 MED ORDER — ASPIRIN 81 MG PO CHEW
81.0000 mg | CHEWABLE_TABLET | Freq: Every day | ORAL | Status: DC
Start: 2015-05-22 — End: 2015-05-22

## 2015-05-22 MED ORDER — ATORVASTATIN CALCIUM 40 MG PO TABS
40.0000 mg | ORAL_TABLET | Freq: Every day | ORAL | Status: DC
  Administered 2015-05-22 – 2015-05-24 (×3): 40 mg via ORAL
  Filled 2015-05-22 (×3): qty 1

## 2015-05-22 NOTE — H&P (Signed)
CC: generalized weakness, fall at home, complete heart block   HPI: 82 yo [place] man with history of HTN, prior alcohol abuse, vascular dementia, CAD, CABG (details not known to me), and atrial flutter (AFL) for which he is on Xarelto for stroke prevention, presented to [facility] ER for evaluation. Family reports that he fell at home (syncope?) in the morning and then could not walk all day. In the evening taken to ER, where HR was in 40;s with high BP and ECG suggested AFL with complete heart block. Dr. Johney Frame was contacted who recommended to transfer the patient to our facility for pacemaker implant on Monday.   Patient denies any pain, fever, chills, N/V, diarrhea, bleeding, orthopnea, PND, edema.    Review of Systems:  12 systems reviewed unremarkable except as noted in HPI    Past Medical History  Diagnosis Date  . CAD in native artery 03/01/2008    h/o 3V CABG 1990 at [facility]  . Abnormal prostate specific antigen 03/30/2008    Normal prostate biopsy Dr. Jamelle Rushing 02/16/2013. Chronic and acutely inflamed prostate tissue.    Marland Kitchen Cerebral infarct (HCC) 10/07/2013    dysarthria, arm weakness  . Hypertension   . Early onset Alzheimer's dementia    No current facility-administered medications on file prior to encounter.   Current Outpatient Prescriptions on File Prior to Encounter  Medication Sig Dispense Refill  . amLODipine (NORVASC) 10 MG tablet TAKE 1 TABLET EVERY DAY 90 tablet 3  . atorvastatin (LIPITOR) 40 MG tablet Take 1 tablet (40 mg total) by mouth daily. 90 tablet 4  . lisinopril-hydrochlorothiazide (PRINZIDE,ZESTORETIC) 20-25 MG per tablet TAKE 1 TABLET EVERY DAY 90 tablet 3  . MULTIPLE VITAMIN PO Take 1 tablet by mouth daily.    . rivaroxaban (XARELTO) 20 MG TABS tablet Take 1 tablet (20 mg total) by mouth daily. 90 tablet 4  . vitamin B-12 (CYANOCOBALAMIN) 1000 MCG tablet Take 1 tablet by mouth daily.      No Known Allergies  Social History   Social History  .  Marital Status: Married    Spouse Name: N/A  . Number of Children: N/A  . Years of Education: N/A   Occupational History  . Not on file.   Social History Main Topics  . Smoking status: Former Games developer  . Smokeless tobacco: Not on file     Comment: quit smoking in the 1990's uses smokeless tobacco  . Alcohol Use: No  . Drug Use: Not on file  . Sexual Activity: Not on file   Other Topics Concern  . Not on file   Social History Narrative    History reviewed. No pertinent family history.  PHYSICAL EXAM: Filed Vitals:   05/22/15 0100 05/22/15 0115  BP: 127/58 110/41  Pulse: 44 41  Temp: 98.4 F (36.9 C)   Resp: 16    General:  Well appearing. No respiratory difficulty HEENT: normal Neck: supple. no JVD. Carotids 2+ bilat; no bruits. No lymphadenopathy or thryomegaly appreciated. Cor: PMI nondisplaced. Regular rate & rhythm. No rubs, gallops or murmurs. Lungs: clear Abdomen: soft, nontender, nondistended. No hepatosplenomegaly. No bruits or masses. Good bowel sounds. Extremities: no cyanosis, clubbing, rash, edema Neuro: alert & oriented x 3, cranial nerves grossly intact. moves all 4 extremities w/o difficulty. Affect pleasant.  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  Results for orders placed or performed during the hospital encounter  of 05/21/15 (from the past 24 hour(s))  Troponin I     Status: Abnormal   Collection Time: 05/21/15  6:44 PM  Result Value Ref Range   Troponin I 0.18 (H) <0.031 ng/mL  CBC with Differential     Status: Abnormal   Collection Time: 05/21/15  6:44 PM  Result Value Ref Range   WBC 12.5 (H) 3.8 - 10.6 K/uL   RBC 4.54 4.40 - 5.90 MIL/uL   Hemoglobin 11.2 (L) 13.0 - 18.0 g/dL   HCT 16.1 (L) 09.6 - 04.5 %   MCV 78.3 (L) 80.0 - 100.0 fL   MCH 24.7 (L) 26.0 - 34.0 pg   MCHC 31.5 (L) 32.0 - 36.0 g/dL   RDW 40.9 (H) 81.1 - 91.4 %   Platelets 294 150 - 440 K/uL   Neutrophils  Relative % 81 %   Neutro Abs 10.2 (H) 1.4 - 6.5 K/uL   Lymphocytes Relative 9 %   Lymphs Abs 1.1 1.0 - 3.6 K/uL   Monocytes Relative 9 %   Monocytes Absolute 1.1 (H) 0.2 - 1.0 K/uL   Eosinophils Relative 0 %   Eosinophils Absolute 0.0 0 - 0.7 K/uL   Basophils Relative 1 %   Basophils Absolute 0.1 0 - 0.1 K/uL  Comprehensive metabolic panel     Status: Abnormal   Collection Time: 05/21/15  6:44 PM  Result Value Ref Range   Sodium 137 135 - 145 mmol/L   Potassium 4.2 3.5 - 5.1 mmol/L   Chloride 107 101 - 111 mmol/L   CO2 23 22 - 32 mmol/L   Glucose, Bld 126 (H) 65 - 99 mg/dL   BUN 31 (H) 6 - 20 mg/dL   Creatinine, Ser 7.82 (H) 0.61 - 1.24 mg/dL   Calcium 8.8 (L) 8.9 - 10.3 mg/dL   Total Protein 6.8 6.5 - 8.1 g/dL   Albumin 3.3 (L) 3.5 - 5.0 g/dL   AST 42 (H) 15 - 41 U/L   ALT 14 (L) 17 - 63 U/L   Alkaline Phosphatase 118 38 - 126 U/L   Total Bilirubin 0.8 0.3 - 1.2 mg/dL   GFR calc non Af Amer 38 (L) >60 mL/min   GFR calc Af Amer 44 (L) >60 mL/min   Anion gap 7 5 - 15  TSH     Status: None   Collection Time: 05/21/15  6:44 PM  Result Value Ref Range   TSH 2.684 0.350 - 4.500 uIU/mL  Urinalysis complete, with microscopic ([facility] only)     Status: Abnormal   Collection Time: 05/21/15  7:58 PM  Result Value Ref Range   Color, Urine YELLOW (A) YELLOW   APPearance HAZY (A) CLEAR   Glucose, UA NEGATIVE NEGATIVE mg/dL   Bilirubin Urine NEGATIVE NEGATIVE   Ketones, ur NEGATIVE NEGATIVE mg/dL   Specific Gravity, Urine 1.018 1.005 - 1.030   Hgb urine dipstick 3+ (A) NEGATIVE   pH 5.0 5.0 - 8.0   Protein, ur >500 (A) NEGATIVE mg/dL   Nitrite NEGATIVE NEGATIVE   Leukocytes, UA NEGATIVE NEGATIVE   RBC / HPF 0-5 0 - 5 RBC/hpf   WBC, UA 0-5 0 - 5 WBC/hpf   Bacteria, UA NONE SEEN NONE SEEN   Squamous Epithelial / LPF NONE SEEN NONE SEEN   Mucous PRESENT    Amorphous Crystal PRESENT    Dg Chest 1 View  05/21/2015  CLINICAL DATA:  Altered mental status ; per family pt fell this AM;  pt states no pain;  hx cad EXAM: CHEST 1 VIEW COMPARISON:  10/05/2013 FINDINGS: Status post median sternotomy and CABG. The heart is enlarged. Elevation of the left hemidiaphragm is stable. There are no focal consolidations or pleural effusions. There is perihilar peribronchial thickening. Possible minimally displaced fracture of the right lateral ninth rib. IMPRESSION: 1. Stable cardiomegaly. 2.  No evidence for acute pulmonary abnormality. 3. Possible fracture of the right ninth rib. No pneumothorax. Recommend correlation with physical exam. Electronically Signed   By: Norva PavlovElizabeth  Brown M.D.   On: 05/21/2015 19:14   Dg Pelvis 1-2 Views  05/21/2015  CLINICAL DATA:  Recent fall with pain, initial encounter EXAM: PELVIS - 1-2 VIEW COMPARISON:  None. FINDINGS: There is no evidence of pelvic fracture or diastasis. No pelvic bone lesions are seen. IMPRESSION: No acute abnormality noted. Electronically Signed   By: Alcide CleverMark  Lukens M.D.   On: 05/21/2015 19:13   Ct Head Wo Contrast  05/21/2015  CLINICAL DATA:  Status post fall, with decreased level of consciousness. Bradycardia. Concern for cervical spine injury. Initial encounter. EXAM: CT HEAD WITHOUT CONTRAST CT CERVICAL SPINE WITHOUT CONTRAST TECHNIQUE: Multidetector CT imaging of the head and cervical spine was performed following the standard protocol without intravenous contrast. Multiplanar CT image reconstructions of the cervical spine were also generated. COMPARISON:  CT of the head performed 10/05/2013, and MRI of the brain performed 10/06/2013 FINDINGS: CT HEAD FINDINGS There is no evidence of acute infarction, mass lesion, or intra- or extra-axial hemorrhage on CT. Prominence of the ventricles and sulci reflects mild to moderate cortical volume loss. Mild cerebellar atrophy is noted. Scattered periventricular and subcortical white matter change likely reflects small vessel ischemic microangiopathy. A chronic lacunar infarct is noted at the right basal  ganglia. The brainstem and fourth ventricle are within normal limits. The cerebral hemispheres demonstrate grossly normal gray-white differentiation. No mass effect or midline shift is seen. There is no evidence of fracture; visualized osseous structures are unremarkable in appearance. The orbits are within normal limits. The paranasal sinuses and mastoid air cells are well-aerated. No significant soft tissue abnormalities are seen. CT CERVICAL SPINE FINDINGS There is no evidence of acute fracture or subluxation. There is chronic osseous fusion at C6-C7. Multilevel disc space narrowing is noted along the lower cervical spine, with scattered anterior and posterior disc osteophyte complexes. There is mild grade 1 anterolisthesis of C 7 on T1, reflecting underlying facet disease. Prevertebral soft tissues are within normal limits. The thyroid gland is unremarkable in appearance. The visualized lung apices are clear. Mild calcification is noted at the carotid bifurcations bilaterally. IMPRESSION: 1. No evidence of traumatic intracranial injury or fracture. 2. No evidence of acute fracture or subluxation along the cervical spine. 3. Mild to moderate cortical volume loss and scattered small vessel ischemic microangiopathy. 4. Chronic lacunar infarct at the right basal ganglia. 5. Mild diffuse degenerative change along the lower cervical spine, with chronic osseous fusion at C6-C7. 6. Mild calcification at the carotid bifurcations bilaterally. Carotid ultrasound would be helpful for further evaluation, when and as deemed clinically appropriate. Electronically Signed   By: [name]   On: 05/21/2015 19:23   Ct Cervical Spine Wo Contrast  05/21/2015  CLINICAL DATA:  Status post fall, with decreased level of consciousness. Bradycardia. Concern for cervical spine injury. Initial encounter. EXAM: CT HEAD WITHOUT CONTRAST CT CERVICAL SPINE WITHOUT CONTRAST TECHNIQUE: Multidetector CT imaging of the head and cervical  spine was performed following the standard protocol without intravenous contrast. Multiplanar CT image reconstructions  of the cervical spine were also generated. COMPARISON:  CT of the head performed 10/05/2013, and MRI of the brain performed 10/06/2013 FINDINGS: CT HEAD FINDINGS There is no evidence of acute infarction, mass lesion, or intra- or extra-axial hemorrhage on CT. Prominence of the ventricles and sulci reflects mild to moderate cortical volume loss. Mild cerebellar atrophy is noted. Scattered periventricular and subcortical white matter change likely reflects small vessel ischemic microangiopathy. A chronic lacunar infarct is noted at the right basal ganglia. The brainstem and fourth ventricle are within normal limits. The cerebral hemispheres demonstrate grossly normal gray-white differentiation. No mass effect or midline shift is seen. There is no evidence of fracture; visualized osseous structures are unremarkable in appearance. The orbits are within normal limits. The paranasal sinuses and mastoid air cells are well-aerated. No significant soft tissue abnormalities are seen. CT CERVICAL SPINE FINDINGS There is no evidence of acute fracture or subluxation. There is chronic osseous fusion at C6-C7. Multilevel disc space narrowing is noted along the lower cervical spine, with scattered anterior and posterior disc osteophyte complexes. There is mild grade 1 anterolisthesis of C 7 on T1, reflecting underlying facet disease. Prevertebral soft tissues are within normal limits. The thyroid gland is unremarkable in appearance. The visualized lung apices are clear. Mild calcification is noted at the carotid bifurcations bilaterally. IMPRESSION: 1. No evidence of traumatic intracranial injury or fracture. 2. No evidence of acute fracture or subluxation along the cervical spine. 3. Mild to moderate cortical volume loss and scattered small vessel ischemic microangiopathy. 4. Chronic lacunar infarct at the right  basal ganglia. 5. Mild diffuse degenerative change along the lower cervical spine, with chronic osseous fusion at C6-C7. 6. Mild calcification at the carotid bifurcations bilaterally. Carotid ultrasound would be helpful for further evaluation, when and as deemed clinically appropriate. Electronically Signed   By: Roanna Raider M.D.   On: 05/21/2015 19:23     ASSESSMENT:  1. Complete AV block with underlying AFL  - No identifiable reversible cause  - BP high normal  - Had fall at home; syncope?   PLAN/DISCUSSION:  Admit to cardiology  Plan for a permanent pacemaker by Dr. Johney Frame on Monday.  Hold Xarelto   Please refer to orders for other details of plan  Nevin Bloodgood, MD Cardiology

## 2015-05-22 NOTE — Progress Notes (Signed)
ANTICOAGULATION CONSULT NOTE - Initial Consult  Pharmacy Consult for Xarelto Indication: atrial fibrillation  No Known Allergies  Patient Measurements: Height: 5\' 7"  (170.2 cm) Weight: 160 lb 14.4 oz (72.984 kg) IBW/kg (Calculated) : 66.1  Vital Signs: Temp: 98.2 F (36.8 C) (03/19 0814) Temp Source: Oral (03/19 0814) BP: 150/77 mmHg (03/19 0814) Pulse Rate: 41 (03/19 0354)  Labs:  Recent Labs  05/21/15 1844 05/22/15 0300 05/22/15 0838  HGB 11.2*  --  11.6*  HCT 35.5*  --  37.8*  PLT 294  --  316  CREATININE 1.63*  --  1.58*  TROPONINI 0.18* 0.44* 0.40*    Estimated Creatinine Clearance: 33.7 mL/min (by C-G formula based on Cr of 1.58).   Medical History: Past Medical History  Diagnosis Date  . CAD in native artery 03/01/2008    [facility]  . Abnormal prostate specific antigen 03/30/2008    Normal prostate biopsy Dr. Jamelle RushingStioff 02/16/2013. Chronic and acutely inflamed prostate tissue.    Marland Kitchen. Cerebral infarct (HCC) 10/07/2013    dysarthria, arm weakness  . Hypertension   . Early onset Alzheimer's dementia     Medications:  Prescriptions prior to admission  Medication Sig Dispense Refill Last Dose  . amLODipine (NORVASC) 10 MG tablet TAKE 1 TABLET EVERY DAY 90 tablet 3 05/20/2015  . atorvastatin (LIPITOR) 40 MG tablet Take 1 tablet (40 mg total) by mouth daily. 90 tablet 4 05/20/2015  . lisinopril-hydrochlorothiazide (PRINZIDE,ZESTORETIC) 20-25 MG per tablet TAKE 1 TABLET EVERY DAY 90 tablet 3 05/20/2015  . MULTIPLE VITAMIN PO Take 1 tablet by mouth daily.   05/20/2015  . rivaroxaban (XARELTO) 20 MG TABS tablet Take 1 tablet (20 mg total) by mouth daily. 90 tablet 4 05/20/2015  . vitamin B-12 (CYANOCOBALAMIN) 1000 MCG tablet Take 1 tablet by mouth daily.   05/20/2015    Assessment: 82 yo M transferred from [place] ED on 05/22/2015 after family reports pt fell at home and then could not walk all day. In ED, found to have HR in 40s with high BP and complete  heart block. Pacemaker placement is planned for Monday 3/20. Patient is on Xarelto PTA for history of Aflutter. Last dose reported as 3/17. Pharmacy consulted to dose Xarelto, will give one time dose today and hold tomorrow's dose in anticipation of pacemaker placement.  SCr 1.58 (CrCl ~33 ml/min), Hgb 11.6 stable, Plt 316. No s/sx of bleeding noted.  Goal of Therapy:  Monitor platelets by anticoagulation protocol: Yes   Plan:  - Xarelto 15 mg PO x1 today - Monitor CBC and s/sx of bleeding - F/u tomorrow after pacemaker placement for when to restart Xarelto  Casilda Carlsaylor Amiria Orrison, PharmD. PGY-1 Pharmacy Resident Pager: [phone] 05/22/2015,10:46 AM

## 2015-05-22 NOTE — Progress Notes (Signed)
Pts bladder scanned after expressing feeling of fullness in urinary bladder. Bladder scan registered 177 ml in bladder. Pt assisted to standing position in effort to help pt void. Standing at bedside produced no success for the patient. In and out catheterization was performed and 800 ml clear yellow urine was produced. Pt has no urge to void at this time. Will continue to monitor.

## 2015-05-22 NOTE — Progress Notes (Signed)
Patient had a 2.05sec pause while sleeping. HR sustaining 40's. VSS see flowsheet. Advised patient not to get OOB without assistance. Patient stated he understood. Will continue to monitor closely.

## 2015-05-22 NOTE — Progress Notes (Signed)
Patient ID: Ryan Villanueva, male   DOB: 03-12-1930, 82 y.o.   MRN: [id number]    SUBJECTIVE: HR in 50s this morning, atypical atrial flutter.  Last night, had about a 2 second pause with HR down to 40s at times.  At [facility], HR in 30s-40s with underlying flutter.   Scheduled Meds: . [START ON 05/23/2015] amLODipine  5 mg Oral Daily  . atorvastatin  40 mg Oral q1800   Continuous Infusions:  PRN Meds:.acetaminophen    Filed Vitals:   05/22/15 0200 05/22/15 0354 05/22/15 0400 05/22/15 0814  BP: 157/102 114/69  150/77  Pulse: 41 41    Temp:  98.2 F (36.8 C)  98.2 F (36.8 C)  TempSrc:  Oral  Oral  Resp: Height:      Weight:      SpO2: 99% 99%  95%    Intake/Output Summary (Last 24 hours) at 05/22/15 1025 Last data filed at 05/22/15 0800  Gross per 24 hour  Intake     50 ml  Output    100 ml  Net    -50 ml    LABS: Basic Metabolic Panel:  Recent Labs  40/98/11 1844 05/22/15 0838  NA 137 140  K 4.2 4.0  CL 107 106  CO2 23 23  GLUCOSE 126* 98  BUN 31* 30*  CREATININE 1.63* 1.58*  CALCIUM 8.8* 8.6*   Liver Function Tests:  Recent Labs  05/21/15 1844  AST 42*  ALT 14*  ALKPHOS 118  BILITOT 0.8  PROT 6.8  ALBUMIN 3.3*   No results for input(s): LIPASE, AMYLASE in the last 72 hours. CBC:  Recent Labs  05/21/15 1844 05/22/15 0838  WBC 12.5* 10.0  NEUTROABS 10.2*  --   HGB 11.2* 11.6*  HCT 35.5* 37.8*  MCV 78.3* 79.4  PLT 294 316   Cardiac Enzymes:  Recent Labs  05/21/15 1844 05/22/15 0300 05/22/15 0838  TROPONINI 0.18* 0.44* 0.40*   BNP: Invalid input(s): POCBNP D-Dimer: No results for input(s): DDIMER in the last 72 hours. Hemoglobin A1C: No results for input(s): HGBA1C in the last 72 hours. Fasting Lipid Panel: No results for input(s): CHOL, HDL, LDLCALC, TRIG, CHOLHDL, LDLDIRECT in the last 72 hours. Thyroid Function Tests:  Recent Labs  05/21/15 1844  TSH 2.684   Anemia Panel: No results for input(s): VITAMINB12,  FOLATE, FERRITIN, TIBC, IRON, RETICCTPCT in the last 72 hours.  RADIOLOGY: Dg Chest 1 View  05/21/2015  CLINICAL DATA:  Altered mental status ; per family pt fell this AM; pt states no pain; hx cad EXAM: CHEST 1 VIEW COMPARISON:  10/05/2013 FINDINGS: Status post median sternotomy and CABG. The heart is enlarged. Elevation of the left hemidiaphragm is stable. There are no focal consolidations or pleural effusions. There is perihilar peribronchial thickening. Possible minimally displaced fracture of the right lateral ninth rib. IMPRESSION: 1. Stable cardiomegaly. 2.  No evidence for acute pulmonary abnormality. 3. Possible fracture of the right ninth rib. No pneumothorax. Recommend correlation with physical exam. Electronically Signed   By: [name]   On: 05/21/2015 19:14   Dg Pelvis 1-2 Views  05/21/2015  CLINICAL DATA:  Recent fall with pain, initial encounter EXAM: PELVIS - 1-2 VIEW COMPARISON:  None. FINDINGS: There is no evidence of pelvic fracture or diastasis. No pelvic bone lesions are seen. IMPRESSION: No acute abnormality noted. Electronically Signed   By: [name]   On: 05/21/2015 19:13   Ct Head Wo  Contrast  05/21/2015  CLINICAL DATA:  Status post fall, with decreased level of consciousness. Bradycardia. Concern for cervical spine injury. Initial encounter. EXAM: CT HEAD WITHOUT CONTRAST CT CERVICAL SPINE WITHOUT CONTRAST TECHNIQUE: Multidetector CT imaging of the head and cervical spine was performed following the standard protocol without intravenous contrast. Multiplanar CT image reconstructions of the cervical spine were also generated. COMPARISON:  CT of the head performed 10/05/2013, and MRI of the brain performed 10/06/2013 FINDINGS: CT HEAD FINDINGS There is no evidence of acute infarction, mass lesion, or intra- or extra-axial hemorrhage on CT. Prominence of the ventricles and sulci reflects mild to moderate cortical volume loss. Mild cerebellar atrophy is noted.  Scattered periventricular and subcortical white matter change likely reflects small vessel ischemic microangiopathy. A chronic lacunar infarct is noted at the right basal ganglia. The brainstem and fourth ventricle are within normal limits. The cerebral hemispheres demonstrate grossly normal gray-white differentiation. No mass effect or midline shift is seen. There is no evidence of fracture; visualized osseous structures are unremarkable in appearance. The orbits are within normal limits. The paranasal sinuses and mastoid air cells are well-aerated. No significant soft tissue abnormalities are seen. CT CERVICAL SPINE FINDINGS There is no evidence of acute fracture or subluxation. There is chronic osseous fusion at C6-C7. Multilevel disc space narrowing is noted along the lower cervical spine, with scattered anterior and posterior disc osteophyte complexes. There is mild grade 1 anterolisthesis of C 7 on T1, reflecting underlying facet disease. Prevertebral soft tissues are within normal limits. The thyroid gland is unremarkable in appearance. The visualized lung apices are clear. Mild calcification is noted at the carotid bifurcations bilaterally. IMPRESSION: 1. No evidence of traumatic intracranial injury or fracture. 2. No evidence of acute fracture or subluxation along the cervical spine. 3. Mild to moderate cortical volume loss and scattered small vessel ischemic microangiopathy. 4. Chronic lacunar infarct at the right basal ganglia. 5. Mild diffuse degenerative change along the lower cervical spine, with chronic osseous fusion at C6-C7. 6. Mild calcification at the carotid bifurcations bilaterally. Carotid ultrasound would be helpful for further evaluation, when and as deemed clinically appropriate. Electronically Signed   By: [name]   On: 05/21/2015 19:23   Ct Cervical Spine Wo Contrast  05/21/2015  CLINICAL DATA:  Status post fall, with decreased level of consciousness. Bradycardia. Concern  for cervical spine injury. Initial encounter. EXAM: CT HEAD WITHOUT CONTRAST CT CERVICAL SPINE WITHOUT CONTRAST TECHNIQUE: Multidetector CT imaging of the head and cervical spine was performed following the standard protocol without intravenous contrast. Multiplanar CT image reconstructions of the cervical spine were also generated. COMPARISON:  CT of the head performed 10/05/2013, and MRI of the brain performed 10/06/2013 FINDINGS: CT HEAD FINDINGS There is no evidence of acute infarction, mass lesion, or intra- or extra-axial hemorrhage on CT. Prominence of the ventricles and sulci reflects mild to moderate cortical volume loss. Mild cerebellar atrophy is noted. Scattered periventricular and subcortical white matter change likely reflects small vessel ischemic microangiopathy. A chronic lacunar infarct is noted at the right basal ganglia. The brainstem and fourth ventricle are within normal limits. The cerebral hemispheres demonstrate grossly normal gray-white differentiation. No mass effect or midline shift is seen. There is no evidence of fracture; visualized osseous structures are unremarkable in appearance. The orbits are within normal limits. The paranasal sinuses and mastoid air cells are well-aerated. No significant soft tissue abnormalities are seen. CT CERVICAL SPINE FINDINGS There is no evidence of acute fracture or subluxation.  There is chronic osseous fusion at C6-C7. Multilevel disc space narrowing is noted along the lower cervical spine, with scattered anterior and posterior disc osteophyte complexes. There is mild grade 1 anterolisthesis of C 7 on T1, reflecting underlying facet disease. Prevertebral soft tissues are within normal limits. The thyroid gland is unremarkable in appearance. The visualized lung apices are clear. Mild calcification is noted at the carotid bifurcations bilaterally. IMPRESSION: 1. No evidence of traumatic intracranial injury or fracture. 2. No evidence of acute fracture or  subluxation along the cervical spine. 3. Mild to moderate cortical volume loss and scattered small vessel ischemic microangiopathy. 4. Chronic lacunar infarct at the right basal ganglia. 5. Mild diffuse degenerative change along the lower cervical spine, with chronic osseous fusion at C6-C7. 6. Mild calcification at the carotid bifurcations bilaterally. Carotid ultrasound would be helpful for further evaluation, when and as deemed clinically appropriate. Electronically Signed   By: Roanna Raider M.D.   On: 05/21/2015 19:23    PHYSICAL EXAM General: NAD Neck: JVP 8 cm, no thyromegaly or thyroid nodule.  Lungs: Clear to auscultation bilaterally with normal respiratory effort. CV: Nondisplaced PMI.  Heart irregular S1/S2, no S3/S4, no murmur.  Trace ankle edema.   Abdomen: Soft, nontender, no hepatosplenomegaly, no distention.  Neurologic: Alert and oriented x 3.  Psych: Normal affect. Extremities: No clubbing or cyanosis.   TELEMETRY: Reviewed telemetry pt in atypical atrial flutter, HR 50s  ASSESSMENT AND PLAN: 82 yo man with history of HTN, prior alcohol abuse, vascular dementia, CAD s/p CABG in 1990 at [facility], and atrial flutter for which he is on Xarelto for stroke prevention presented to [facility] ER on 3/18. Family reports that he fell at home (syncope?) in the morning and then could not walk all day. In the evening taken to ER, where HR was in 30s-40s with high BP and underlying atrial flutter. Dr. Johney Frame was contacted who recommended to transfer the patient to our facility for pacemaker implant on Monday.  1. Bradycardia: HR in 30s-40s with fall, ?syncope at home. Has underlying atypical atrial flutter with possible CHB at presentation yesterday.  This morning, remains in atypical flutter with HR in 50s.  He has not been on nodal blockade at home.  - I agree that he needs a pacemaker based on initial presentation.  Will keep NPO, EP to see Monday.  2. CAD: s/p CABG in 1990.  Mild  elevation in TnI with no trend so far.  Probably demand ischemia in setting of bradycardia.  He has had no chest pain.  - Will get one more troponin draw to make sure no uptrend.  - Will get echo to make sure that EF is not down a lot => had EF 50-55% on 2015 echo.  If EF down significantly, would consider catheterization.  - Continue atorvastatin, not on ASA with Xarelto use.  3. Vascular dementia: Able to converse with me but does not remember a lot about yesterday when he was admitted.  4. Atrial flutter: On Xarelto but no nodal blockade at home.  Will continue Xarelto today (renally dose) and hold on day of PPM (tomorrow).  5. Elevated creatinine: Stable, may be baseline.   Marca Ancona 05/22/2015 10:38 AM

## 2015-05-23 ENCOUNTER — Inpatient Hospital Stay (HOSPITAL_COMMUNITY): Payer: Commercial Managed Care - HMO

## 2015-05-23 DIAGNOSIS — R531 Weakness: Secondary | ICD-10-CM

## 2015-05-23 DIAGNOSIS — I442 Atrioventricular block, complete: Secondary | ICD-10-CM | POA: Diagnosis not present

## 2015-05-23 DIAGNOSIS — R001 Bradycardia, unspecified: Secondary | ICD-10-CM

## 2015-05-23 LAB — CBC
HEMATOCRIT: 36.2 % — AB (ref 39.0–52.0)
Hemoglobin: 11.2 g/dL — ABNORMAL LOW (ref 13.0–17.0)
MCH: 24.5 pg — ABNORMAL LOW (ref 26.0–34.0)
MCHC: 30.9 g/dL (ref 30.0–36.0)
MCV: 79 fL (ref 78.0–100.0)
Platelets: 332 10*3/uL (ref 150–400)
RBC: 4.58 MIL/uL (ref 4.22–5.81)
RDW: 19.5 % — AB (ref 11.5–15.5)
WBC: 12.2 10*3/uL — ABNORMAL HIGH (ref 4.0–10.5)

## 2015-05-23 LAB — BASIC METABOLIC PANEL
Anion gap: 10 (ref 5–15)
BUN: 31 mg/dL — AB (ref 6–20)
CO2: 24 mmol/L (ref 22–32)
Calcium: 8.5 mg/dL — ABNORMAL LOW (ref 8.9–10.3)
Chloride: 105 mmol/L (ref 101–111)
Creatinine, Ser: 1.59 mg/dL — ABNORMAL HIGH (ref 0.61–1.24)
GFR calc Af Amer: 45 mL/min — ABNORMAL LOW (ref >60)
GFR, EST NON AFRICAN AMERICAN: 39 mL/min — AB (ref >60)
GLUCOSE: 111 mg/dL — AB (ref 65–99)
POTASSIUM: 4.2 mmol/L (ref 3.5–5.1)
Sodium: 139 mmol/L (ref 135–145)

## 2015-05-23 MED ORDER — SODIUM CHLORIDE 0.45 % IV SOLN
INTRAVENOUS | Status: DC
  Administered 2015-05-23: 06:00:00 via INTRAVENOUS

## 2015-05-23 MED ORDER — SODIUM CHLORIDE 0.9 % IR SOLN: 80.0000 mg | Status: DC

## 2015-05-23 MED ORDER — CHLORHEXIDINE GLUCONATE 4 % EX LIQD
60.0000 mL | Freq: Once | CUTANEOUS | Status: AC
  Administered 2015-05-23: 4 via TOPICAL

## 2015-05-23 MED ORDER — CEFAZOLIN SODIUM-DEXTROSE 2-3 GM-% IV SOLR: 2.0000 g | INTRAVENOUS | Status: DC

## 2015-05-23 MED ORDER — SODIUM CHLORIDE 0.9 % IV SOLN
INTRAVENOUS | Status: DC
  Administered 2015-05-23: 06:00:00 via INTRAVENOUS

## 2015-05-23 MED ORDER — CHLORHEXIDINE GLUCONATE 4 % EX LIQD: 60.0000 mL | Freq: Once | CUTANEOUS | Status: AC

## 2015-05-23 MED ORDER — CHLORHEXIDINE GLUCONATE 4 % EX LIQD
CUTANEOUS | Status: AC
  Filled 2015-05-23: qty 15

## 2015-05-23 NOTE — Progress Notes (Addendum)
Neurology input appreciated. Will await plain films of knee. Anticipate discharge to SNF tomorrow.  Gypsy BalsamAmber Seiler, NP 05/23/2015 3:26 PM  Addendum - Knee plain film results noted - will need ortho consult - will call in the morning  Gypsy BalsamAmber Seiler, NP 05/23/2015 6:02 PM

## 2015-05-23 NOTE — Evaluation (Signed)
Physical Therapy Evaluation Patient Details Name: Ryan Villanueva MRN: [id number] DOB: Sep 04, 1930 Today's Date: 05/23/2015   History of Present Illness  82 y.o. male CAD/CABG 1990, HTN, AFlutter, CVA, on Xarelto and hx of ETOH abuse with vascular dementia, originally evaluated at [facility] after a fall vs syncope at home.  Clinical Impression  Patient demonstrates deficits in functional mobility as indicated below. Will need continued skilled PT to address deficits and maximize function. Will see as indicated and progress as tolerated. ON NOTE: VSS with HR in the 50s-60s with activity. Patient noted to have tremors with movement and appears to have pain in LLE (knee) when attempting to bear weight. No noted bruising or edema at this time, will continue to monitor.    Follow Up Recommendations SNF;Supervision/Assistance - 24 hour    Equipment Recommendations   (tbd)    Recommendations for Other Services       Precautions / Restrictions Precautions Precautions: Fall Restrictions Weight Bearing Restrictions: No      Mobility  Bed Mobility Overal bed mobility: Needs Assistance Bed Mobility: Rolling;Supine to Sit Rolling: Min assist   Supine to sit: Mod assist;+2 for physical assistance     General bed mobility comments: min assist to initiate movement and position properly for rolling, increased assist of 2 persons to elevate to upright, patient reporting pain in left knee, with grimace/guarding reaction making it difficulty to power up to sitting.   Transfers Overall transfer level: Needs assistance   Transfers: Sit to/from Stand;Stand Pivot Transfers Sit to Stand: Mod assist;+2 physical assistance Stand pivot transfers: Max assist;+2 physical assistance       General transfer comment: Attempted sit to stand transfer, +2 physical assist required, patient unable to maintain weight brearing on LLE without increased physical assist. During pivot to chair, patient with poor ability to  shuffle step, increased assist to position trunk and swing hips toward drop arm region of recliner to complete transfer.   Ambulation/Gait             General Gait Details: attempted but unable to perform, switched to stand-pivot to drop arm recliner  Stairs            Wheelchair Mobility    Modified Rankin (Stroke Patients Only)       Balance Overall balance assessment: History of Falls                                           Pertinent Vitals/Pain Pain Assessment: Faces Faces Pain Scale: Hurts even more Pain Location: left knee Pain Descriptors / Indicators: Grimacing;Guarding Pain Intervention(s): Monitored during session;Limited activity within patient's tolerance;Repositioned    Home Living Family/patient expects to be discharged to:: Private residence Living Arrangements: Spouse/significant other;Children Available Help at Discharge: Family Type of Home: House                Prior Function Level of Independence: Independent         Comments: was mobilizing until fall, then unable to ambulate     Hand Dominance   Dominant Hand: Right    Extremity/Trunk Assessment   Upper Extremity Assessment: Generalized weakness           Lower Extremity Assessment: LLE deficits/detail;Generalized weakness   LLE Deficits / Details: patient with pain in left knee with all movements, unable to maintain standing (left LE buckling). Unable to formally assess secondary  to grimacing and guarding     Communication   Communication: HOH  Cognition Arousal/Alertness: Awake/alert Behavior During Therapy: WFL for tasks assessed/performed Overall Cognitive Status: History of cognitive impairments - at baseline                      General Comments      Exercises        Assessment/Plan    PT Assessment Patient needs continued PT services  PT Diagnosis Difficulty walking;Abnormality of gait;Generalized weakness;Acute  pain;Altered mental status   PT Problem List Decreased strength;Decreased activity tolerance;Decreased balance;Decreased mobility;Decreased cognition;Decreased coordination;Pain;Decreased safety awareness  PT Treatment Interventions DME instruction;Gait training;Stair training;Functional mobility training;Therapeutic activities;Therapeutic exercise;Balance training;Patient/family education   PT Goals (Current goals can be found in the Care Plan section) Acute Rehab PT Goals Patient Stated Goal: to be able to walk and return home PT Goal Formulation: With patient/family Time For Goal Achievement: 06/06/15 Potential to Achieve Goals: Good    Frequency Min 2X/week   Barriers to discharge        Co-evaluation               End of Session Equipment Utilized During Treatment: Gait belt Activity Tolerance: Patient tolerated treatment well;Patient limited by pain Patient left: in chair;with call bell/phone within reach;with chair alarm set;with family/visitor present Nurse Communication: Mobility status         Time: [id number] PT Time Calculation (min) (ACUTE ONLY): 24 min   Charges:   PT Evaluation $PT Eval Moderate Complexity: 1 Procedure PT Treatments $Therapeutic Activity: 8-22 mins   PT G CodesFabio Asa:        Bayley Yarborough J 05/23/2015, 10:56 AM Charlotte Crumbevon Timouthy Gilardi, PT DPT  [phone]

## 2015-05-23 NOTE — Consult Note (Signed)
Reason for Consult:heart block and atrial flutter  Referring Physician: [name] is an 82 y.o. male.   HPI: the patient is an 82 yo man with CAD, s/p CABG, HTN and dementia who fell and was found to be intermittently bradycardic. His ECG from [facility] was read as having CHB and he is transferred for PPM insertion. Unfortunately, the patient has atrial flutter and at times a slow rate but not CHB. The patient does not currently have complete heart block. His HR is in the 60's and he cannot walk. His wife states that he has had ongoing deterioration of memory and has gone from walking regularly to sitting all day. He fell on Saturday and was not noted to be unconscious. He does not remember the incident. He thinks that he still drives but his wife and daughter note that he does not. He denies chest pain or sob. He denies peripheral edema.  PMH: Past Medical History  Diagnosis Date  . CAD in native artery 03/01/2008    h/o 3V CABG 1990 at [facility]  . Abnormal prostate specific antigen 03/30/2008    Normal prostate biopsy Dr. Jamelle Rushing 02/16/2013. Chronic and acutely inflamed prostate tissue.    Marland Kitchen Cerebral infarct (HCC) 10/07/2013    dysarthria, arm weakness  . Hypertension   . Early onset Alzheimer's dementia     PSHX: Past Surgical History  Procedure Laterality Date  . History of coronary artery bypass graft  1990    x3 [facility]    FAMHX:History reviewed. No pertinent family history.  Social History:  reports that he has quit smoking. He does not have any smokeless tobacco history on file. He reports that he does not drink alcohol. His drug history is not on file.  Allergies: No Known Allergies  Medications: I have reviewed the patient's current medications.  Dg Chest 1 View  05/21/2015  CLINICAL DATA:  Altered mental status ; per family pt fell this AM; pt states no pain; hx cad EXAM: CHEST 1 VIEW COMPARISON:  10/05/2013 FINDINGS: Status post median sternotomy and CABG.  The heart is enlarged. Elevation of the left hemidiaphragm is stable. There are no focal consolidations or pleural effusions. There is perihilar peribronchial thickening. Possible minimally displaced fracture of the right lateral ninth rib. IMPRESSION: 1. Stable cardiomegaly. 2.  No evidence for acute pulmonary abnormality. 3. Possible fracture of the right ninth rib. No pneumothorax. Recommend correlation with physical exam. Electronically Signed   By: [name]   On: 05/21/2015 19:14   Dg Pelvis 1-2 Views  05/21/2015  CLINICAL DATA:  Recent fall with pain, initial encounter EXAM: PELVIS - 1-2 VIEW COMPARISON:  None. FINDINGS: There is no evidence of pelvic fracture or diastasis. No pelvic bone lesions are seen. IMPRESSION: No acute abnormality noted. Electronically Signed   By: [name]   On: 05/21/2015 19:13   Ct Head Wo Contrast  05/21/2015  CLINICAL DATA:  Status post fall, with decreased level of consciousness. Bradycardia. Concern for cervical spine injury. Initial encounter. EXAM: CT HEAD WITHOUT CONTRAST CT CERVICAL SPINE WITHOUT CONTRAST TECHNIQUE: Multidetector CT imaging of the head and cervical spine was performed following the standard protocol without intravenous contrast. Multiplanar CT image reconstructions of the cervical spine were also generated. COMPARISON:  CT of the head performed 10/05/2013, and MRI of the brain performed 10/06/2013 FINDINGS: CT HEAD FINDINGS There is no evidence of acute infarction, mass lesion, or intra- or extra-axial hemorrhage on CT. Prominence  of the ventricles and sulci reflects mild to moderate cortical volume loss. Mild cerebellar atrophy is noted. Scattered periventricular and subcortical white matter change likely reflects small vessel ischemic microangiopathy. A chronic lacunar infarct is noted at the right basal ganglia. The brainstem and fourth ventricle are within normal limits. The cerebral hemispheres demonstrate grossly normal  gray-white differentiation. No mass effect or midline shift is seen. There is no evidence of fracture; visualized osseous structures are unremarkable in appearance. The orbits are within normal limits. The paranasal sinuses and mastoid air cells are well-aerated. No significant soft tissue abnormalities are seen. CT CERVICAL SPINE FINDINGS There is no evidence of acute fracture or subluxation. There is chronic osseous fusion at C6-C7. Multilevel disc space narrowing is noted along the lower cervical spine, with scattered anterior and posterior disc osteophyte complexes. There is mild grade 1 anterolisthesis of C 7 on T1, reflecting underlying facet disease. Prevertebral soft tissues are within normal limits. The thyroid gland is unremarkable in appearance. The visualized lung apices are clear. Mild calcification is noted at the carotid bifurcations bilaterally. IMPRESSION: 1. No evidence of traumatic intracranial injury or fracture. 2. No evidence of acute fracture or subluxation along the cervical spine. 3. Mild to moderate cortical volume loss and scattered small vessel ischemic microangiopathy. 4. Chronic lacunar infarct at the right basal ganglia. 5. Mild diffuse degenerative change along the lower cervical spine, with chronic osseous fusion at C6-C7. 6. Mild calcification at the carotid bifurcations bilaterally. Carotid ultrasound would be helpful for further evaluation, when and as deemed clinically appropriate. Electronically Signed   By: [name]   On: 05/21/2015 19:23   Ct Cervical Spine Wo Contrast  05/21/2015  CLINICAL DATA:  Status post fall, with decreased level of consciousness. Bradycardia. Concern for cervical spine injury. Initial encounter. EXAM: CT HEAD WITHOUT CONTRAST CT CERVICAL SPINE WITHOUT CONTRAST TECHNIQUE: Multidetector CT imaging of the head and cervical spine was performed following the standard protocol without intravenous contrast. Multiplanar CT image reconstructions of  the cervical spine were also generated. COMPARISON:  CT of the head performed 10/05/2013, and MRI of the brain performed 10/06/2013 FINDINGS: CT HEAD FINDINGS There is no evidence of acute infarction, mass lesion, or intra- or extra-axial hemorrhage on CT. Prominence of the ventricles and sulci reflects mild to moderate cortical volume loss. Mild cerebellar atrophy is noted. Scattered periventricular and subcortical white matter change likely reflects small vessel ischemic microangiopathy. A chronic lacunar infarct is noted at the right basal ganglia. The brainstem and fourth ventricle are within normal limits. The cerebral hemispheres demonstrate grossly normal gray-white differentiation. No mass effect or midline shift is seen. There is no evidence of fracture; visualized osseous structures are unremarkable in appearance. The orbits are within normal limits. The paranasal sinuses and mastoid air cells are well-aerated. No significant soft tissue abnormalities are seen. CT CERVICAL SPINE FINDINGS There is no evidence of acute fracture or subluxation. There is chronic osseous fusion at C6-C7. Multilevel disc space narrowing is noted along the lower cervical spine, with scattered anterior and posterior disc osteophyte complexes. There is mild grade 1 anterolisthesis of C 7 on T1, reflecting underlying facet disease. Prevertebral soft tissues are within normal limits. The thyroid gland is unremarkable in appearance. The visualized lung apices are clear. Mild calcification is noted at the carotid bifurcations bilaterally. IMPRESSION: 1. No evidence of traumatic intracranial injury or fracture. 2. No evidence of acute fracture or subluxation along the cervical spine. 3. Mild to moderate cortical volume loss  and scattered small vessel ischemic microangiopathy. 4. Chronic lacunar infarct at the right basal ganglia. 5. Mild diffuse degenerative change along the lower cervical spine, with chronic osseous fusion at C6-C7. 6.  Mild calcification at the carotid bifurcations bilaterally. Carotid ultrasound would be helpful for further evaluation, when and as deemed clinically appropriate. Electronically Signed   By: Roanna Raider M.D.   On: 05/21/2015 19:23    ROS  As stated in the HPI and negative for all other systems.  Physical Exam  Vitals:Blood pressure 149/60, pulse 53, temperature 98.4 F (36.9 C), temperature source Oral, resp. rate 19, height  (1.702 m), weight 168 lb 6.4 oz (76.386 kg), SpO2 94 %.  Elderly appearing NAD HEENT: Unremarkable Neck:  No JVD, no thyromegally Lymphatics:  No adenopathy Back:  No CVA tenderness Lungs:  Clear HEART:  IRegular rate rhythm, no murmurs, no rubs, no clicks Abd:  soft, positive bowel sounds, no organomegally, no rebound, no guarding Ext:  2 plus pulses, no edema, no cyanosis, no clubbing Skin:  No rashes no nodules Neuro:  CN II through XII intact, motor grossly intact  ECG - atrial flutter with variable VR  Tele - atrial flutter with a variable VR  Assessment/Plan: 1. Atrial flutter with a variable ventricular response - he currently has no indication for PPM insertion. He is not a candidate for systemic anti-coagulation at this point.  2. Weakness - he cannot stand. Will ask neuro to see. I am not sure this patient is best kept on the EP service. 3. Complete heart block - his ECG was read incorrectly. He had atrial flutter with a slow VR. His VR is now improved. There is no indication for PPM. When he was in his primary MD's office in 9/16, he was in atrial flutter with a VR of 42/min with no significant problems.  Ryan Villanueva,M.D.   Ryan Gowda TaylorMD 05/23/2015, 9:52 AM

## 2015-05-23 NOTE — Progress Notes (Signed)
Patient assisted OOB to chair beside sink for Hibiclens scrub and bath this morning. Patient is two person assist and unable to put weight on legs. Upon two tries and rocking back and forth patient able to stand. Patient very unsteady on his feet and needs two person assist or hoyer lift to get up. Will notify MD and start with PT evaluation and recomedations. Pacer placement prep done with Hibiclens and lead placement.  IV's started as directed. Patient unable to sign consent at present. Disoriented at times. Will notify MD and continue to monitor.

## 2015-05-23 NOTE — Care Management Note (Signed)
Case Management Note  Patient Details  Name: Ryan Villanueva MRN: [id number] Date of Birth: 01/31/1931  Subjective/Objective:        Adm w av block           Action/Plan: lives w fam, pcp dr Radiographer, therapeuticfisher   Expected Discharge Date:                 Expected Discharge Plan:  Home/Self Care  In-House Referral:     Discharge planning Services     Post Acute Care Choice:    Choice offered to:     DME Arranged:    DME Agency:     HH Arranged:    HH Agency:     Status of Service:     Medicare Important Message Given:    Date Medicare IM Given:    Medicare IM give by:    Date Additional Medicare IM Given:    Additional Medicare Important Message give by:     If discussed at Long Length of Stay Meetings, dates discussed:    Additional Comments: ur review done  Hanley HaysDowell, Mohd. Derflinger T, RN 05/23/2015, 9:30 AM

## 2015-05-23 NOTE — Consult Note (Addendum)
NEURO HOSPITALIST CONSULT NOTE   Requestig physician: Dr. Rennis GoldenHilty   Reason for Consult:Gait unsteadiness with fall.   History obtained from:   Patient, family and Chart     HPI:                                                                                                                                          Ryan Villanueva is an 82 y.o. male with a 2 year progressive history of gait disturbance and cognitive decline. Gait is described as having "small steps" with feet not rising very far off the ground when walking. Family denies a shuffling quality. He has had gradual decline in memory and orientation x 2 years per family - the patient states that he has had memory changes that are "normal for my age".  He had a fall that precipitated admission. No LOC. Denies prior history of falls.   Family and patient deny history of urinary or bowel incontinence.   Past Medical History  Diagnosis Date  . CAD in native artery 03/01/2008    [facility]  . Abnormal prostate specific antigen 03/30/2008    Normal prostate biopsy Dr. Jamelle RushingStioff 02/16/2013. Chronic and acutely inflamed prostate tissue.    Marland Kitchen. Cerebral infarct (HCC) 10/07/2013    dysarthria, arm weakness  . Hypertension   . Early onset Alzheimer's dementia     Past Surgical History  Procedure Laterality Date  . History of coronary artery bypass graft  1990    [facility]    History reviewed. No pertinent family history.  Social History:  reports that he has quit smoking. He does not have any smokeless tobacco history on file. He reports that he does not drink alcohol. His drug history is not on file.  No Known Allergies  MEDICATIONS:                                                                                                                     No current facility-administered medications on file prior to encounter.   Current Outpatient Prescriptions on File Prior to Encounter  Medication  Sig Dispense Refill  . amLODipine (NORVASC) 10 MG tablet TAKE 1 TABLET EVERY DAY 90 tablet 3  . atorvastatin (LIPITOR) 40 MG  tablet Take 1 tablet (40 mg total) by mouth daily. 90 tablet 4  . lisinopril-hydrochlorothiazide (PRINZIDE,ZESTORETIC) 20-25 MG per tablet TAKE 1 TABLET EVERY DAY 90 tablet 3  . MULTIPLE VITAMIN PO Take 1 tablet by mouth daily.    . rivaroxaban (XARELTO) 20 MG TABS tablet Take 1 tablet (20 mg total) by mouth daily. 90 tablet 4  . vitamin B-12 (CYANOCOBALAMIN) 1000 MCG tablet Take 1 tablet by mouth daily.        ROS:                                                                                                                                       History obtained from patient. Does not endorse fevers, chills or headache.   Blood pressure 133/63, pulse 61, temperature 98.3 F (36.8 C), temperature source Oral, resp. rate 18, height  (1.702 m), weight 76.386 kg (168 lb 6.4 oz), SpO2 94 %.   Neurologic Examination:                                                                                                      HEENT-  Normocephalic, no lesions, without obvious abnormality.  MMM.  Lungs- No audible wheezes.   Extremities- Right knee swelling with tenderness noted.    Neurological Examination Mental Status: Alert, with poor orientation. Paucity of ideas. Pleasant and cooperative. Good eye contact. Mild occasional phonemic paraphasias. Naming intact. Mild difficulty following some commands. Fluent.   Cranial Nerves: II: Visual fields grossly normal, PERRL III,IV, VI: ptosis not present, EOMI without nystagmus V,VII: smile symmetric, temperature sensation normal bilaterally VIII: hearing intact to conversation XI: Symmetric XII: midline tongue extension Motor: Right : Upper extremity   5/5      Lower extremity   Impaired by pain. Ankle dorsiflexion is 5/5. Left:     Upper extremity   5/5       Lower extremity   5/5 Normal tone throughout; no atrophy  noted Sensory:  light touch intact x 4 without extinction.  Deep Tendon Reflexes: Hypoactive x 4.  Plantars: Mute on right. Downgoing on left.  Cerebellar: No ataxia on FNF. t Gait: Unable to test due to severe right knee pain.    Lab Results: Basic Metabolic Panel:  Recent Labs Lab 05/21/15 1844 05/22/15 0838 05/23/15 0255  NA 137 140 139  K 4.2 4.0 4.2  CL 107 106 105  CO2 GLUCOSE 126* 98 111*  BUN 31* 30* 31*  CREATININE 1.63* 1.58* 1.59*  CALCIUM 8.8* 8.6* 8.5*    Liver Function Tests:  Recent Labs Lab 05/21/15 1844  AST 42*  ALT 14*  ALKPHOS 118  BILITOT 0.8  PROT 6.8  ALBUMIN 3.3*   No results for input(s): LIPASE, AMYLASE in the last 168 hours. No results for input(s): AMMONIA in the last 168 hours.  CBC:  Recent Labs Lab 05/21/15 1844 05/22/15 0838 05/23/15 0255  WBC 12.5* 10.0 12.2*  NEUTROABS 10.2*  --   --   HGB 11.2* 11.6* 11.2*  HCT 35.5* 37.8* 36.2*  MCV 78.3* 79.4 79.0  PLT 294 316 332    Cardiac Enzymes:  Recent Labs Lab 05/21/15 1844 05/22/15 0300 05/22/15 0838 05/22/15 1347  TROPONINI 0.18* 0.44* 0.40* 0.27*    Lipid Panel: No results for input(s): CHOL, TRIG, HDL, CHOLHDL, VLDL, LDLCALC in the last 168 hours.  CBG: No results for input(s): GLUCAP in the last 168 hours.  Microbiology: Results for orders placed or performed during the hospital encounter of 05/22/15  MRSA PCR Screening     Status: None   Collection Time: 05/22/15 12:40 AM  Result Value Ref Range Status   MRSA by PCR NEGATIVE NEGATIVE Final    Comment:        The GeneXpert MRSA Assay (FDA approved for NASAL specimens only), is one component of a comprehensive MRSA colonization surveillance program. It is not intended to diagnose MRSA infection nor to guide or monitor treatment for MRSA infections.     Coagulation Studies: No results for input(s): LABPROT, INR in the last 72 hours.  Imaging: Dg Chest 1 View  05/21/2015  CLINICAL  DATA:  Altered mental status ; per family pt fell this AM; pt states no pain; hx cad EXAM: CHEST 1 VIEW COMPARISON:  10/05/2013 FINDINGS: Status post median sternotomy and CABG. The heart is enlarged. Elevation of the left hemidiaphragm is stable. There are no focal consolidations or pleural effusions. There is perihilar peribronchial thickening. Possible minimally displaced fracture of the right lateral ninth rib. IMPRESSION: 1. Stable cardiomegaly. 2.  No evidence for acute pulmonary abnormality. 3. Possible fracture of the right ninth rib. No pneumothorax. Recommend correlation with physical exam. Electronically Signed   By: [name]   On: 05/21/2015 19:14   Dg Pelvis 1-2 Views  05/21/2015  CLINICAL DATA:  Recent fall with pain, initial encounter EXAM: PELVIS - 1-2 VIEW COMPARISON:  None. FINDINGS: There is no evidence of pelvic fracture or diastasis. No pelvic bone lesions are seen. IMPRESSION: No acute abnormality noted. Electronically Signed   By: [name]   On: 05/21/2015 19:13   Ct Head Wo Contrast  05/21/2015  CLINICAL DATA:  Status post fall, with decreased level of consciousness. Bradycardia. Concern for cervical spine injury. Initial encounter. EXAM: CT HEAD WITHOUT CONTRAST CT CERVICAL SPINE WITHOUT CONTRAST TECHNIQUE: Multidetector CT imaging of the head and cervical spine was performed following the standard protocol without intravenous contrast. Multiplanar CT image reconstructions of the cervical spine were also generated. COMPARISON:  CT of the head performed 10/05/2013, and MRI of the brain performed 10/06/2013 FINDINGS: CT HEAD FINDINGS There is no evidence of acute infarction, mass lesion, or intra- or extra-axial hemorrhage on CT. Prominence of the ventricles and sulci reflects mild to moderate cortical volume loss. Mild cerebellar atrophy is noted. Scattered periventricular and subcortical white matter change likely reflects small vessel ischemic microangiopathy. A  chronic lacunar infarct is noted at the right basal ganglia. The brainstem  and fourth ventricle are within normal limits. The cerebral hemispheres demonstrate grossly normal gray-white differentiation. No mass effect or midline shift is seen. There is no evidence of fracture; visualized osseous structures are unremarkable in appearance. The orbits are within normal limits. The paranasal sinuses and mastoid air cells are well-aerated. No significant soft tissue abnormalities are seen. CT CERVICAL SPINE FINDINGS There is no evidence of acute fracture or subluxation. There is chronic osseous fusion at C6-C7. Multilevel disc space narrowing is noted along the lower cervical spine, with scattered anterior and posterior disc osteophyte complexes. There is mild grade 1 anterolisthesis of C 7 on T1, reflecting underlying facet disease. Prevertebral soft tissues are within normal limits. The thyroid gland is unremarkable in appearance. The visualized lung apices are clear. Mild calcification is noted at the carotid bifurcations bilaterally. IMPRESSION: 1. No evidence of traumatic intracranial injury or fracture. 2. No evidence of acute fracture or subluxation along the cervical spine. 3. Mild to moderate cortical volume loss and scattered small vessel ischemic microangiopathy. 4. Chronic lacunar infarct at the right basal ganglia. 5. Mild diffuse degenerative change along the lower cervical spine, with chronic osseous fusion at C6-C7. 6. Mild calcification at the carotid bifurcations bilaterally. Carotid ultrasound would be helpful for further evaluation, when and as deemed clinically appropriate. Electronically Signed   By: Roanna Raider M.D.   On: 05/21/2015 19:23   Ct Cervical Spine Wo Contrast  05/21/2015  CLINICAL DATA:  Status post fall, with decreased level of consciousness. Bradycardia. Concern for cervical spine injury. Initial encounter. EXAM: CT HEAD WITHOUT CONTRAST CT CERVICAL SPINE WITHOUT CONTRAST  TECHNIQUE: Multidetector CT imaging of the head and cervical spine was performed following the standard protocol without intravenous contrast. Multiplanar CT image reconstructions of the cervical spine were also generated. COMPARISON:  CT of the head performed 10/05/2013, and MRI of the brain performed 10/06/2013 FINDINGS: CT HEAD FINDINGS There is no evidence of acute infarction, mass lesion, or intra- or extra-axial hemorrhage on CT. Prominence of the ventricles and sulci reflects mild to moderate cortical volume loss. Mild cerebellar atrophy is noted. Scattered periventricular and subcortical white matter change likely reflects small vessel ischemic microangiopathy. A chronic lacunar infarct is noted at the right basal ganglia. The brainstem and fourth ventricle are within normal limits. The cerebral hemispheres demonstrate grossly normal gray-white differentiation. No mass effect or midline shift is seen. There is no evidence of fracture; visualized osseous structures are unremarkable in appearance. The orbits are within normal limits. The paranasal sinuses and mastoid air cells are well-aerated. No significant soft tissue abnormalities are seen. CT CERVICAL SPINE FINDINGS There is no evidence of acute fracture or subluxation. There is chronic osseous fusion at C6-C7. Multilevel disc space narrowing is noted along the lower cervical spine, with scattered anterior and posterior disc osteophyte complexes. There is mild grade 1 anterolisthesis of C 7 on T1, reflecting underlying facet disease. Prevertebral soft tissues are within normal limits. The thyroid gland is unremarkable in appearance. The visualized lung apices are clear. Mild calcification is noted at the carotid bifurcations bilaterally. IMPRESSION: 1. No evidence of traumatic intracranial injury or fracture. 2. No evidence of acute fracture or subluxation along the cervical spine. 3. Mild to moderate cortical volume loss and scattered small vessel  ischemic microangiopathy. 4. Chronic lacunar infarct at the right basal ganglia. 5. Mild diffuse degenerative change along the lower cervical spine, with chronic osseous fusion at C6-C7. 6. Mild calcification at the carotid bifurcations bilaterally. Carotid ultrasound would be  helpful for further evaluation, when and as deemed clinically appropriate. Electronically Signed   By: Roanna Raider M.D.   On: 05/21/2015 19:23     Assessment: 1. Progressive cognitive decline and gait dysfunction without incontinence. CT head reveals mild to moderate cortical volume loss and scattered small vessel ischemic microangiopathy and a chronic lacunar infarct at the right basal ganglia.  On my review of the images, widening of the cortical sulci is proportionate to ventricular widening, most consistent with diffuse atrophy and not militating in favor of NPH. Unable to perform gait testing due to moderate/severe right knee pain.  2. No evidence for acute intracranial injury, skull fracture or traumatic cervical spine injury seen on CT.  3. Elevated BUN/Cr.  4. Right knee tenderness with swelling.   Plan: 1. Outpatient Neurology follow up for dementia evaluation. This will require repeat imaging with MRI and detailed neurocognitive testing.  2. Plain films of right knee to rule out fracture. May need MRI of knee if plain films are negative.  3. Please call the Neurology consult service if there are further questions.   Caryl Pina, MD 05/23/2015, 12:58 PM

## 2015-05-23 NOTE — Consult Note (Signed)
ELECTROPHYSIOLOGY CONSULT NOTE    Patient ID: Ryan Villanueva MRN: [id number], DOB/AGE: 10-18-1930 82 y.o.  Admit date: 05/22/2015 Date of Consult: 05/23/2015  Primary Physician: Mila Merry, MD Primary Cardiologist:  Reason for Consultation: evaluate for possible PPM implant  HPI: Ryan Villanueva is a 82 y.o. male CAD/CABG 1990, HTN, AFlutter, CVA, on Xarelto and hx of ETOH abuse with vascular dementia, originally evaluated at [facility] after a fall vs syncope at home. AT [facility] he was noted to be bradycardic with rates reported 30's-40's and here observed to have pause up to 2 seconds while asleep.   PMHx is obtained from the chart and to a minimal degree the patient who has dementia.  Chart notes [facility] ER noted that the family reported a fall at home that the that the patient was unable to get up from that had happened about 8-9AM, the patient arrived via EMS to [facility] 6:35PM.  Notes make mention of decreased LOC, and that EMS found him with HR 40's and administered one dose of atropine with HR 50's afterwards.  Record states he was in CHB with RBBB at 45bpm.  H&P mentions after the fall the patient was unable to ambulate all day and was brought into the ER that evening.  The patient this morning is oriented to himself and can tell me his birth month, he knows he is in the hospital but tells me for a problem with his feet.  He denies fainting at home, but does say he fell but attributes this to that he slipped or tripped.  ER record states family reported patient at his baseline MS.  Home medicines reviewed, on amlodipine, no nodal blocking agents TSH is wnl, 2.684 Trop 0.18, 0.44, 0.40, 0.27 P.BNP 1377 K+ 4.2   Past Medical History  Diagnosis Date  . CAD in native artery 03/01/2008    h/o 3V CABG 1990 at [facility]  . Abnormal prostate specific antigen 03/30/2008    Normal prostate biopsy Dr. Jamelle Rushing 02/16/2013. Chronic and acutely inflamed prostate tissue.    Marland Kitchen Cerebral infarct (HCC) 10/07/2013   dysarthria, arm weakness  . Hypertension   . Early onset Alzheimer's dementia      Surgical History:  Past Surgical History  Procedure Laterality Date  . History of coronary artery bypass graft  1990    x3 [facility]     Prescriptions prior to admission  Medication Sig Dispense Refill Last Dose  . amLODipine (NORVASC) 10 MG tablet TAKE 1 TABLET EVERY DAY 90 tablet 3 05/20/2015  . atorvastatin (LIPITOR) 40 MG tablet Take 1 tablet (40 mg total) by mouth daily. 90 tablet 4 05/20/2015  . lisinopril-hydrochlorothiazide (PRINZIDE,ZESTORETIC) 20-25 MG per tablet TAKE 1 TABLET EVERY DAY 90 tablet 3 05/20/2015  . MULTIPLE VITAMIN PO Take 1 tablet by mouth daily.   05/20/2015  . rivaroxaban (XARELTO) 20 MG TABS tablet Take 1 tablet (20 mg total) by mouth daily. 90 tablet 4 05/20/2015  . vitamin B-12 (CYANOCOBALAMIN) 1000 MCG tablet Take 1 tablet by mouth daily.   05/20/2015    Inpatient Medications:  . amLODipine  5 mg Oral Daily  . atorvastatin  40 mg Oral q1800  .  ceFAZolin (ANCEF) IV  2 g Intravenous On Call  . gentamicin irrigation  80 mg Irrigation On Call    Allergies: No Known Allergies  Social History   Social History  . Marital Status: Married    Spouse Name: N/A  . Number of Children: N/A  . Years  of Education: N/A   Occupational History  . Not on file.   Social History Main Topics  . Smoking status: Former Games developermoker  . Smokeless tobacco: Not on file     Comment: quit smoking in the 1990's uses smokeless tobacco  . Alcohol Use: No  . Drug Use: Not on file  . Sexual Activity: Not on file   Other Topics Concern  . Not on file   Social History Narrative     History reviewed. No pertinent family history.   Review of Systems: All other systems reviewed and are otherwise negative except as noted above.  Physical Exam: Filed Vitals:   05/22/15 2351 05/23/15 0000 05/23/15 0500 05/23/15 0627  BP: 135/56 151/55  138/57  Pulse: 60 59  55  Temp: 99.6 F (37.6 C)    99.1 F (37.3 C)  TempSrc: Oral   Oral  Resp: 25 22  18   Height:      Weight:   168 lb 6.4 oz (76.386 kg)   SpO2: 95% 92%     GEN- The patient is sleeping comfortably and easily woken, appears in NAD, alert and oriented to self.   HEENT: normocephalic, atraumatic; sclera clear, conjunctiva pink; hearing intact; oropharynx clear; neck supple, no JVP Lymph- no cervical lymphadenopathy Lungs- Clear to ausculation bilaterally, normal work of breathing.  No wheezes, rales, rhonchi Heart- Irregular rate and rhythm, no murmurs, rubs or gallops, PMI not laterally displaced GI- soft, non-tender, non-distended, bowel sounds present Extremities- no clubbing, cyanosis, or edema; DP/PT/radial pulses 2+ bilaterally MS- no significant deformity or atrophy Skin- warm and dry, no rash or lesion Psych- euthymic mood, full affect Neuro- no gross deficits observed  Labs:   Lab Results  Component Value Date   WBC 12.2* 05/23/2015   HGB 11.2* 05/23/2015   HCT 36.2* 05/23/2015   MCV 79.0 05/23/2015   PLT 332 05/23/2015    Recent Labs Lab 05/21/15 1844  05/23/15 0255  NA 137  < > 139  K 4.2  < > 4.2  CL 107  < > 105  CO2 23  < > 24  BUN 31*  < > 31*  CREATININE 1.63*  < > 1.59*  CALCIUM 8.8*  < > 8.5*  PROT 6.8  --   --   BILITOT 0.8  --   --   ALKPHOS 118  --   --   ALT 14*  --   --   AST 42*  --   --   GLUCOSE 126*  < > 111*  < > = values in this interval not displayed.    Radiology/Studies:  Dg Chest 1 View 05/21/2015  CLINICAL DATA:  Altered mental status ; per family pt fell this AM; pt states no pain; hx cad EXAM: CHEST 1 VIEW COMPARISON:  10/05/2013 FINDINGS: Status post median sternotomy and CABG. The heart is enlarged. Elevation of the left hemidiaphragm is stable. There are no focal consolidations or pleural effusions. There is perihilar peribronchial thickening. Possible minimally displaced fracture of the right lateral ninth rib. IMPRESSION: 1. Stable cardiomegaly. 2.  No  evidence for acute pulmonary abnormality. 3. Possible fracture of the right ninth rib. No pneumothorax. Recommend correlation with physical exam. Electronically Signed   By: Norva PavlovElizabeth  Brown M.D.   On: 05/21/2015 19:14   Dg Pelvis 1-2 Views 05/21/2015  CLINICAL DATA:  Recent fall with pain, initial encounter EXAM: PELVIS - 1-2 VIEW COMPARISON:  None. FINDINGS: There is no evidence of pelvic fracture or  diastasis. No pelvic bone lesions are seen. IMPRESSION: No acute abnormality noted. Electronically Signed   By: [name]   On: 05/21/2015 19:13   Ct Head Wo Contrast 05/21/2015  CLINICAL DATA:  Status post fall, with decreased level of consciousness. Bradycardia. Concern for cervical spine injury. Initial encounter. EXAM: CT HEAD WITHOUT CONTRAST CT CERVICAL SPINE WITHOUT CONTRAST TECHNIQUE: Multidetector CT imaging of the head and cervical spine was performed following the standard protocol without intravenous contrast. Multiplanar CT image reconstructions of the cervical spine were also generated. COMPARISON:  CT of the head performed 10/05/2013, and MRI of the brain performed 10/06/2013 FINDINGS: CT HEAD FINDINGS There is no evidence of acute infarction, mass lesion, or intra- or extra-axial hemorrhage on CT. Prominence of the ventricles and sulci reflects mild to moderate cortical volume loss. Mild cerebellar atrophy is noted. Scattered periventricular and subcortical white matter change likely reflects small vessel ischemic microangiopathy. A chronic lacunar infarct is noted at the right basal ganglia. The brainstem and fourth ventricle are within normal limits. The cerebral hemispheres demonstrate grossly normal gray-white differentiation. No mass effect or midline shift is seen. There is no evidence of fracture; visualized osseous structures are unremarkable in appearance. The orbits are within normal limits. The paranasal sinuses and mastoid air cells are well-aerated. No significant soft tissue  abnormalities are seen. CT CERVICAL SPINE FINDINGS There is no evidence of acute fracture or subluxation. There is chronic osseous fusion at C6-C7. Multilevel disc space narrowing is noted along the lower cervical spine, with scattered anterior and posterior disc osteophyte complexes. There is mild grade 1 anterolisthesis of C 7 on T1, reflecting underlying facet disease. Prevertebral soft tissues are within normal limits. The thyroid gland is unremarkable in appearance. The visualized lung apices are clear. Mild calcification is noted at the carotid bifurcations bilaterally. IMPRESSION: 1. No evidence of traumatic intracranial injury or fracture. 2. No evidence of acute fracture or subluxation along the cervical spine. 3. Mild to moderate cortical volume loss and scattered small vessel ischemic microangiopathy. 4. Chronic lacunar infarct at the right basal ganglia. 5. Mild diffuse degenerative change along the lower cervical spine, with chronic osseous fusion at C6-C7. 6. Mild calcification at the carotid bifurcations bilaterally. Carotid ultrasound would be helpful for further evaluation, when and as deemed clinically appropriate. Electronically Signed   By: Roanna Raider M.D.   On: 05/21/2015 19:23     EKG:  #1 baseline artifact, RBBB, 55bpm #2Aflutter, RBBB, 45bpm 11/25/14: Aflutter RBBB, 42bpm  TELEMETRY: Aflutter generally appears 40-50's  05/22/15: Echocardiogram Study Conclusions - Left ventricle: The cavity size was normal. Wall thickness was  increased in a pattern of mild LVH. Indeterminant diastolic  function, atrial flutter. The estimated ejection fraction was  50%. Mild inferior and inferolateral hypokinesis. - Aortic valve: There was no stenosis. - Mitral valve: There was trivial regurgitation. - Right ventricle: The cavity size was mildly dilated. Systolic  function was normal. - Right atrium: The atrium was mildly to moderately dilated. - Tricuspid valve: Peak RV-RA  gradient (S): 35 mm Hg. - Pulmonary arteries: PA peak pressure: 38 mm Hg (S). - Inferior vena cava: The vessel was normal in size. The  respirophasic diameter changes were in the normal range (>= 50%),  consistent with normal central venous pressure. Impressions: - Normal LV size with EF 50%, mild inferior and inferolateral  hypokinesis. Mildly dilated RV with normal systolic function.  Mild pulmonary hypertension.   Assessment and Plan:   1. AFlutter,  ?Symptomatic  bradycardia     CHA2DS2Vasc is at least 7 on Xarelto, has been held for poss PPM     No reversible causes for bradycardia, ?symptomatic.  He has had HR 40's historically as well     BP stable     The patient this morning with HR 50's-60's, 40's overnight.     RN states he was a full assist to get up from bed, his BP was good and HR 50's His weakness does not appear to be secondary to bradycardia at this time at least Uncertain event at home EKG is AFlutter with HR 45, with reports of CHB at [facility] Will await Dr. Ladona Ridgel for final decision on PPM  2. CAD No CP Abnormal Trop, last down from prior, felt to be demand ischemia by cardiology service secondary to bradycarida  3. Elevated pBNP Exam appears euvolemic No CHF on CXR  4. Dementia ER reported family stated patient at baseline   Signed, Francis Dowse, Cordelia Poche 05/23/2015 6:41 AM  EP Attending  Patient seen and examined. See my note.   Leonia Reeves.D.

## 2015-05-24 ENCOUNTER — Telehealth: Payer: Self-pay

## 2015-05-24 ENCOUNTER — Encounter
Admission: RE | Admit: 2015-05-24 | Discharge: 2015-05-24 | Disposition: A | Discharge status: Home / Self Care | Payer: Commercial Managed Care - HMO | Source: Ambulatory Visit | Attending: Internal Medicine | Admitting: Internal Medicine

## 2015-05-24 DIAGNOSIS — I484 Atypical atrial flutter: Secondary | ICD-10-CM

## 2015-05-24 NOTE — Telephone Encounter (Signed)
-----   Message from Coralee RudSabrina F Gilley sent at 05/24/2015  9:54 AM EDT ----- Regarding: tcm/ph ([place]) 4/47/2017 Dr. Alvino ChapelIngal 2:30

## 2015-05-24 NOTE — Progress Notes (Signed)
Spoke w pt who has dementia, wife and da. Pt was cleared by cardiology. Pt to transfer per fam. neurol and ortho still following pt. phy there has req snf and fam not opposed but prefer [place] [facility] sw consult placed. Will cont to follow.

## 2015-05-24 NOTE — Clinical Social Work Note (Signed)
Clinical Social Work Assessment  Patient Details  Name: Ryan Villanueva MRN: [id number] Date of Birth: 07/11/1930  Date of referral:  05/24/15               Reason for consult:  Facility Placement                Permission sought to share information with:  Facility Medical sales representativeContact Representative, Family Supports Permission granted to share information::  Yes, Verbal Permission Granted  Name::     Selena BattenKim  Agency::  [facility]  Relationship::  Daughter  Contact Information:  [phone]  Housing/Transportation Living arrangements for the past 2 months:  Single Family Home Source of Information:  Spouse Patient Interpreter Needed:  None Criminal Activity/Legal Involvement Pertinent to Current Situation/Hospitalization:  No - Comment as needed Significant Relationships:  Adult Children, Spouse Lives with:  Spouse Do you feel safe going back to the place where you live?  No Need for family participation in patient care:  Yes (Comment)  Care giving concerns:  CSW received referral for possible SNF placement at time of discharge. Patient is disoriented. CSW spoke with patient's daughter regarding PT recommendation of SNF placement at time of discharge. Per patient's daughter, patient's wife is currently unable to care for patient at their home given patient's current physical needs and fall risk. Patient's daughter expressed understanding of PT recommendation and is agreeable to SNF placement at time of discharge. CSW to continue to follow and assist with discharge planning needs.   Social Worker assessment / plan:  CSW spoke with patient's daughter concerning [facility] before returning home. CSW submitted for [employer] insurance authorization.  Employment status:  Retired Database administratornsurance information:  Managed Medicare PT Recommendations:  Skilled Nursing Facility Information / Referral to community resources:  Skilled Nursing Facility  Patient/Family's Response to care:  Patient's  daughter recognizes need for rehab before patient returns home and is agreeable [place] a [place]F [place]. Patient reported preference for [facility]. Patient's daughter reported concerns over various medical consults that were placed for her father.  Patient/Family's Understanding of and Emotional Response to Diagnosis, Current Treatment, and Prognosis:  Patient is realistic regarding therapy needs. No questions/concerns about plan or treatment.    Emotional Assessment Appearance:  Appears stated age Attitude/Demeanor/Rapport:  Unable to Assess (Patient is disoriented) Affect (typically observed):  Unable to Assess Orientation:  Oriented to Self Alcohol / Substance use:  Not Applicable Psych involvement (Current and /or in the community):  No (Comment)  Discharge Needs  Concerns to be addressed:  Care Coordination Readmission within the last 30 days:  No Current discharge risk:  None Barriers to Discharge:  Continued Medical Work up   [employer]ia S Evelena Masci, LCSWA 05/24/2015, 4:46 PM

## 2015-05-24 NOTE — Telephone Encounter (Signed)
Attempted to contact pt regarding discharge [facility] on 05/24/14. Left message asking pt to call back regarding discharge instructions and/or medications. Advised pt of appt w/ Dr. Alvino ChapelIngal on 06/07/15 at 2:30 w/ [facility]. Asked pt to call back if unable to keep this appt.

## 2015-05-24 NOTE — NC FL2 (Signed)
   MEDICAID FL2 LEVEL OF CARE SCREENING TOOL     IDENTIFICATION  Patient Name: Ryan Villanueva Birthdate: 04/13/1930 Sex: male Admission Date (Current Location): 05/22/2015  [facility] [place]   Facility and Address:  [facility], [address], [place], [place] [address]      Provider Number: [id number]  Attending Physician Name and Address:  Chrystie NoseKenneth C Hilty, MD  Relative Name and Phone Number:       Current Level of Care: Hospital Recommended Level of Care: Skilled Nursing Facility Prior Approval Number:    Date Approved/Denied:   PASRR Number: [id number]  Discharge Plan: SNF    Current Diagnoses: Patient Active Problem List   Diagnosis Date Noted  . AV heart block 05/22/2015  . Atypical atrial flutter (HCC)   . Iron deficiency anemia 03/30/2015  . Acute renal insufficiency 03/30/2015  . H/O coronary artery bypass surgery 11/25/2014  . Late effects of CVA (cerebrovascular accident) 11/25/2014  . Atrial flutter (HCC) 11/04/2014  . Bradycardia 11/04/2014  . 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 HB (heart block) 11/04/2014  . Speech disorder 11/04/2014  . CAD in native artery 03/01/2008  . Arthritis due to gout 03/05/2005  . Essential (primary) hypertension 03/05/1988  . Hypercholesteremia 03/05/1988    Orientation RESPIRATION BLADDER Height & Weight     Self  O2 (2L [place]) Continent Weight: 170 lb (77.111 kg) Height:  5\' 7"  (170.2 cm)  BEHAVIORAL SYMPTOMS/MOOD NEUROLOGICAL BOWEL NUTRITION STATUS      Continent Diet (cardiac)  AMBULATORY STATUS COMMUNICATION OF NEEDS Skin   Extensive Assist Verbally Normal                       Personal Care Assistance Level of Assistance  Bathing, Dressing Bathing Assistance: Maximum assistance   Dressing Assistance: Maximum assistance     Functional Limitations Info  Hearing   Hearing Info: Impaired      SPECIAL CARE FACTORS FREQUENCY   PT (By licensed PT), OT (By licensed OT)     PT Frequency: 5/wk OT Frequency: 5/wk            Contractures      Additional Factors Info  Code Status, Allergies Code Status Info: FULL Allergies Info: NKA           Current Medications (05/24/2015):  This is the current hospital active medication list Current Facility-Administered Medications  Medication Dose Route Frequency Provider Last Rate Last Dose  . acetaminophen (TYLENOL) tablet 650 mg  650 mg Oral Q4H PRN Nevin BloodgoodAmir Azeem, MD      . amLODipine (NORVASC) tablet 5 mg  5 mg Oral Daily Laurey Moralealton S McLean, MD   5 mg at 05/24/15 1032  . atorvastatin (LIPITOR) tablet 40 mg  40 mg Oral q1800 Nevin BloodgoodAmir Azeem, MD   40 mg at 05/23/15 1804     Discharge Medications: Please see discharge summary for a list of discharge medications.  Relevant Imaging Results:  Relevant Lab Results:   Additional Information SS#: [id number]  Izora RibasHoloman, Deyanna Mctier M, [place]

## 2015-05-24 NOTE — Progress Notes (Signed)
SUBJECTIVE: The patient is complaining of knee pain this morning. No chest pain or shortness of breath   CURRENT MEDICATIONS: . amLODipine  5 mg Oral Daily  . atorvastatin  40 mg Oral q1800      OBJECTIVE: Physical Exam: Filed Vitals:   05/23/15 2200 05/23/15 2332 05/24/15 0340 05/24/15 0724  BP:  154/73 152/55 150/68  Pulse:  62 61   Temp:  98 F (36.7 C) 98.7 F (37.1 C) 98.5 F (36.9 C)  TempSrc:  Oral Oral Oral  Resp:  21 19 24   Height:      Weight:   170 lb (77.111 kg)   SpO2: 99% 98% 98% 98%    Intake/Output Summary (Last 24 hours) at 05/24/15 [id number] Last data filed at 05/24/15 0700  Gross per 24 hour  Intake    480 ml  Output    500 ml  Net    -20 ml    Telemetry reveals atrial flutter, ventricular rates 50's  GEN- The patient is elderly appearing, sleeping but rouses, confused, pleasant Head- normocephalic, atraumatic Eyes-  Sclera clear, conjunctiva pink Ears- hearing intact Oropharynx- clear Neck- supple  Lungs- Clear to ausculation bilaterally, normal work of breathing Heart- Bradycardic irregular rate and rhythm  GI- soft, NT, ND, + BS Extremities- no clubbing, cyanosis, or edema Skin- no rash or lesion Psych- euthymic mood, full affect Neuro- strength and sensation are intact MS- moderate mediate R knee effusion  LABS: Basic Metabolic Panel:  Recent Labs  11/91/4703/19/17 0838 05/23/15 0255  NA 140 139  K 4.0 4.2  CL 106 105  CO2 23 24  GLUCOSE 98 111*  BUN 30* 31*  CREATININE 1.58* 1.59*  CALCIUM 8.6* 8.5*   Liver Function Tests:  Recent Labs  05/21/15 1844  AST 42*  ALT 14*  ALKPHOS 118  BILITOT 0.8  PROT 6.8  ALBUMIN 3.3*    CBC:  Recent Labs  05/21/15 1844 05/22/15 0838 05/23/15 0255  WBC 12.5* 10.0 12.2*  NEUTROABS 10.2*  --   --   HGB 11.2* 11.6* 11.2*  HCT 35.5* 37.8* 36.2*  MCV 78.3* 79.4 79.0  PLT 294 316 332   Cardiac Enzymes:  Recent Labs  05/22/15 0300 05/22/15 0838 05/22/15 1347  TROPONINI 0.44*  0.40* 0.27*   Thyroid Function Tests:  Recent Labs  05/21/15 1844  TSH 2.684    RADIOLOGY: Dg Chest 1 View 05/21/2015  CLINICAL DATA:  Altered mental status ; per family pt fell this AM; pt states no pain; hx cad EXAM: CHEST 1 VIEW COMPARISON:  10/05/2013 FINDINGS: Status post median sternotomy and CABG. The heart is enlarged. Elevation of the left hemidiaphragm is stable. There are no focal consolidations or pleural effusions. There is perihilar peribronchial thickening. Possible minimally displaced fracture of the right lateral ninth rib. IMPRESSION: 1. Stable cardiomegaly. 2.  No evidence for acute pulmonary abnormality. 3. Possible fracture of the right ninth rib. No pneumothorax. Recommend correlation with physical exam. Electronically Signed   By: Norva PavlovElizabeth  Brown M.D.   On: 05/21/2015 19:14   Dg Knee 1-2 Views Right 05/23/2015  CLINICAL DATA:  82 year old male with right-sided knee pain and swelling EXAM: RIGHT KNEE - 1-2 VIEW COMPARISON:  No priors. FINDINGS: Large right suprapatellar joint effusion no acute displaced fracture. Joint space narrowing, subchondral sclerosis, subchondral cyst formation and osteophyte formation in a tricompartmental distribution, most severe in the medial and patellofemoral compartments, compatible with osteoarthritis. IMPRESSION: 1. Large suprapatellar joint effusion. 2. Moderate knee joint  osteoarthritis, most severe in the medial and patellofemoral compartments. Electronically Signed   By: Trudie Reed M.D.   On: 05/23/2015 17:53   ASSESSMENT AND PLAN:  Active Problems:   AV heart block   Atypical atrial flutter (HCC)  1.  Atypical atrial flutter Ventricular rates stable No indication for PPM at this time.  Would avoid ablation. Resume Xarelto after evaluation by ortho for CHADS2VASC of 6  2.  Right knee pain/large suprapatellar joint effusion Left message for ortho this morning for help with management  3.  CAD No recent ischemic  symptoms Troponin trend flat No indication for ischemic eval at this time  4.  Dementia Will need outpatient neuro evaluation   Will transfer to telemetry today. Pending ortho evaluation, could potentially be discharged to [facility]  Gypsy Balsam, NP 05/24/2015 8:07 AM   I have seen, examined the patient, and reviewed the above assessment and plan.  On exam, bradycardic irregular rhythm. Changes to above are made where necessary.    Co Sign: Hillis Range, MD 05/24/2015 8:10 AM

## 2015-05-24 NOTE — Discharge Summary (Signed)
ELECTROPHYSIOLOGY PROCEDURE DISCHARGE SUMMARY    Patient ID: Ryan Villanueva,  MRN: [id number], DOB/AGE: 1930/11/23 82 y.o.  Admit date: 05/22/2015 Discharge date: 05/24/2015  Primary Care Physician: Mila Merry, MD Primary Cardiologist: [facility] - will follow up with Dr Graciela Husbands in [facility]  Primary Discharge Diagnosis:  1.  Atrial flutter with controlled ventricular response 2.  Right knee effusion  Secondary Diagnosis: 1.  Vascular dementia 2.  CAD s/p CABG 3.  Hypertension 4.  Prior CVA   No Known Allergies  Consultants: Neurology Orthopedics - by phone   Brief HPI/Hospital Course:  Ryan Villanueva is a 82 y.o. male with a past medical history as outlined above. He presented to [facility] with reports of a fall and possible syncope.  EKG there was read as complete heart block and he was transferred to [facility] for further evaluation.  EKG demonstrated atrial flutter with controlled ventricular response which he has had for several months and has not had associated symptoms. He was monitored on telemetry without significant bradyarrhythmias.  He was seen by neurology who recommended outpatient work up for dementia. Due to right knee pain, he underwent x-ray which showed large right knee suprapatellar effusion. Discussed with Dr Eulah Pont from orthopedics who recommended conservative management and mobilization.  The patient was seen by PT who recommended SNF at discharge.  He was seen by Dr Johney Frame and considered stable for discharge to SNF.  With acute knee effusion and also not felt to be a good candidate for long-term anticoagulation by Dr Ladona Ridgel this admission, Xarelto will be held at discharge. This should be re-evaluated as an outpatient with CHADS2VASC of 7.    Physical Exam: Filed Vitals:   05/23/15 2200 05/23/15 2332 05/24/15 0340 05/24/15 0724  BP:  154/73 152/55 150/68  Pulse:  62 61   Temp:  98 F (36.7 C) 98.7 F (37.1 C) 98.5 F (36.9 C)  TempSrc:  Oral Oral Oral   Resp:  Height:      Weight:   170 lb (77.111 kg)   SpO2: 99% 98% 98% 98%    Labs:   Lab Results  Component Value Date   WBC 12.2* 05/23/2015   HGB 11.2* 05/23/2015   HCT 36.2* 05/23/2015   MCV 79.0 05/23/2015   PLT 332 05/23/2015    Recent Labs Lab 05/21/15 1844  05/23/15 0255  NA 137  < > 139  K 4.2  < > 4.2  CL 107  < > 105  CO2 23  < > 24  BUN 31*  < > 31*  CREATININE 1.63*  < > 1.59*  CALCIUM 8.8*  < > 8.5*  PROT 6.8  --   --   BILITOT 0.8  --   --   ALKPHOS 118  --   --   ALT 14*  --   --   AST 42*  --   --   GLUCOSE 126*  < > 111*  < > = values in this interval not displayed.   Discharge Medications:  Current Discharge Medication List    CONTINUE these medications which have NOT CHANGED   Details  amLODipine (NORVASC) 10 MG tablet TAKE 1 TABLET EVERY DAY Qty: 90 tablet, Refills: 3    atorvastatin (LIPITOR) 40 MG tablet Take 1 tablet (40 mg total) by mouth daily. Qty: 90 tablet, Refills: 4    lisinopril-hydrochlorothiazide (PRINZIDE,ZESTORETIC) 20-25 MG per tablet TAKE 1 TABLET EVERY DAY Qty: 90 tablet,  Refills: 3    MULTIPLE VITAMIN PO Take 1 tablet by mouth daily.    vitamin B-12 (CYANOCOBALAMIN) 1000 MCG tablet Take 1 tablet by mouth daily.      STOP taking these medications     rivaroxaban (XARELTO) 20 MG TABS tablet         Disposition: Pt is being discharged to SNF today in stable condition  Discharge Instructions    Diet - low sodium heart healthy    Complete by:  As directed      Increase activity slowly    Complete by:  As directed           Follow-up Information    Follow up with MURPHY, TIMOTHY D, MD In 1 week.   Specialty:  Orthopedic Surgery   Why:  call the office for appointment   Contact information:   [address] [place] [address] [phone]       Follow up with [facility] In 4 weeks.   Why:  call the office for appointment   Contact information:   [address]     [address] [place] [id number] [phone]      Follow up with Mila Merryonald Fisher, MD In 1 week.   Specialty:  Family Medicine   Why:  call the office for appointment   Contact information:   [address] [place] [address] [phone]       Follow up with Almond LintAileen Ingal, MD On 06/07/2015.   Specialty:  Cardiology   Why:  at 2:30PM    Contact information:   [address] [place] [address] [phone]       Duration of Discharge Encounter: Greater than 30 minutes including physician time.  Signed, Gypsy BalsamAmber Seiler, NP 05/24/2015 9:54 AM  I have seen, examined the patient, and reviewed the above assessment and plan.  No indication for EP procedures at this time. Changes to above are made where necessary.  Discharge to home.  Follow-up with general cardiology.  Co Sign: Hillis RangeJames Ayodele Hartsock, MD 05/25/2015 8:59 AM

## 2015-05-24 NOTE — Clinical Social Work Placement (Signed)
   CLINICAL SOCIAL WORK PLACEMENT  NOTE  Date:  05/24/2015  Patient Details  Name: Ryan BurdockRufus L Vigilante MRN: [id number] Date of Birth: 04/04/1930  Clinical Social Work is seeking post-discharge placement for this patient at the Skilled  Nursing Facility level of care (*CSW will initial, date and re-position this form in  chart as items are completed):  Yes   Patient/family provided with [facility] Clinical Social Work Department's list of facilities offering this level of care within the geographic area requested by the patient (or if unable, by the patient's family).  Yes   Patient/family informed of their freedom to choose among providers that offer the needed level of care, that participate in Medicare, Medicaid or managed care program needed by the patient, have an available bed and are willing to accept the patient.  Yes   Patient/family informed of [facility]'s ownership interest in [facility] and [facility], as well as of the fact that they are under no obligation to receive care at these facilities.  PASRR submitted to EDS on 05/24/15     PASRR number received on 05/24/15     Existing PASRR number confirmed on       FL2 transmitted to all facilities in geographic area requested by pt/family on 05/24/15     FL2 transmitted to all facilities within larger geographic area on       Patient informed that his/her managed care company has contracts with or will negotiate with certain facilities, including the following:        Yes   Patient/family informed of bed offers received.  [facility]     Physician recommends and patient chooses bed at      Patient to be transferred [facility] on 05/25/15.  Patient to be transferred to facility by [facility]     Patient family notified on 05/25/15 of transfer.  Name of family member notified:  Daughter     PHYSICIAN       Additional Comment:     _______________________________________________ Mearl LatinNadia S Kaho Selle, LCSWA 05/24/2015, 4:52 PM

## 2015-05-25 DIAGNOSIS — I484 Atypical atrial flutter: Secondary | ICD-10-CM | POA: Diagnosis not present

## 2015-05-25 DIAGNOSIS — M6281 Muscle weakness (generalized): Secondary | ICD-10-CM | POA: Diagnosis not present

## 2015-05-25 DIAGNOSIS — X58XXXA Exposure to other specified factors, initial encounter: Secondary | ICD-10-CM | POA: Diagnosis not present

## 2015-05-25 DIAGNOSIS — F0150 Vascular dementia without behavioral disturbance: Secondary | ICD-10-CM | POA: Diagnosis not present

## 2015-05-25 DIAGNOSIS — M25561 Pain in right knee: Secondary | ICD-10-CM | POA: Diagnosis not present

## 2015-05-25 DIAGNOSIS — G309 Alzheimer's disease, unspecified: Secondary | ICD-10-CM | POA: Diagnosis not present

## 2015-05-25 DIAGNOSIS — F0280 Dementia in other diseases classified elsewhere without behavioral disturbance: Secondary | ICD-10-CM | POA: Diagnosis not present

## 2015-05-25 DIAGNOSIS — M659 Synovitis and tenosynovitis, unspecified: Secondary | ICD-10-CM | POA: Diagnosis not present

## 2015-05-25 DIAGNOSIS — M25569 Pain in unspecified knee: Secondary | ICD-10-CM | POA: Diagnosis not present

## 2015-05-25 DIAGNOSIS — G9340 Encephalopathy, unspecified: Secondary | ICD-10-CM | POA: Diagnosis not present

## 2015-05-25 DIAGNOSIS — F1021 Alcohol dependence, in remission: Secondary | ICD-10-CM | POA: Diagnosis not present

## 2015-05-25 DIAGNOSIS — I4892 Unspecified atrial flutter: Secondary | ICD-10-CM | POA: Diagnosis not present

## 2015-05-25 DIAGNOSIS — I519 Heart disease, unspecified: Secondary | ICD-10-CM | POA: Diagnosis not present

## 2015-05-25 DIAGNOSIS — M2391 Unspecified internal derangement of right knee: Secondary | ICD-10-CM | POA: Diagnosis not present

## 2015-05-25 DIAGNOSIS — F0390 Unspecified dementia without behavioral disturbance: Secondary | ICD-10-CM | POA: Diagnosis not present

## 2015-05-25 DIAGNOSIS — I2510 Atherosclerotic heart disease of native coronary artery without angina pectoris: Secondary | ICD-10-CM | POA: Diagnosis not present

## 2015-05-25 DIAGNOSIS — R2689 Other abnormalities of gait and mobility: Secondary | ICD-10-CM | POA: Diagnosis not present

## 2015-05-25 DIAGNOSIS — E875 Hyperkalemia: Secondary | ICD-10-CM | POA: Diagnosis not present

## 2015-05-25 DIAGNOSIS — I10 Essential (primary) hypertension: Secondary | ICD-10-CM | POA: Diagnosis not present

## 2015-05-25 DIAGNOSIS — I4430 Unspecified atrioventricular block: Secondary | ICD-10-CM | POA: Diagnosis not present

## 2015-05-25 DIAGNOSIS — D649 Anemia, unspecified: Secondary | ICD-10-CM | POA: Diagnosis not present

## 2015-05-25 DIAGNOSIS — M25461 Effusion, right knee: Secondary | ICD-10-CM | POA: Diagnosis not present

## 2015-05-25 DIAGNOSIS — Z9181 History of falling: Secondary | ICD-10-CM | POA: Diagnosis not present

## 2015-05-25 DIAGNOSIS — S83241A Other tear of medial meniscus, current injury, right knee, initial encounter: Secondary | ICD-10-CM | POA: Diagnosis not present

## 2015-05-25 DIAGNOSIS — I69398 Other sequelae of cerebral infarction: Secondary | ICD-10-CM | POA: Diagnosis not present

## 2015-05-25 NOTE — Progress Notes (Signed)
Patient will DC to: [facility] Anticipated DC date: 3/22/217 Family notified: Daughter Transport by: [facility]  CSW signing off.  Cristobal GoldmannNadia Anasia Agro, [place] Clinical Social Worker [phone]

## 2015-05-25 NOTE — Care Management Important Message (Signed)
Important Message  Patient Details  Name: Ryan BurdockRufus L Krouse MRN: [id number] Date of Birth: 02/17/1931   Medicare Important Message Given:  Yes    Devun Anna P Lillianne Eick 05/25/2015, 12:30 PM

## 2015-05-25 NOTE — Progress Notes (Signed)
Report called to facility. IV removed. Telemetry discontinued.

## 2015-05-25 NOTE — Care Management Note (Signed)
Case Management Note Previous CM note initiated by Dorcas Carroweborah Dowell RN, CM  Patient Details  Name: Ryan BurdockRufus L Fadden MRN: [id number] Date of Birth: 09/07/1930  Subjective/Objective:        Adm w av block           Action/Plan: lives w fam, pcp dr Sherrie Mustachefisher   Expected Discharge Date:  05/25/15               Expected Discharge Plan:  Skilled Nursing Facility  In-House Referral:  Clinical Social Work  Discharge planning Services  CM Consult  Post Acute Care Choice:    Choice offered to:     DME Arranged:    DME Agency:     HH Arranged:    HH Agency:     Status of Service:  Completed, signed off  Medicare Important Message Given:    Date Medicare IM Given:    Medicare IM give by:    Date Additional Medicare IM Given:    Additional Medicare Important Message give by:     If discussed at Long Length of Stay Meetings, dates discussed:    Additional Comments: ur review done   05/25/15- Donn PieriniKristi Menna Abeln RN, BSN- [facility], CSW for placement needs  Darrold SpanWebster, Skilynn Durney Hall, RN 05/25/2015, 11:24 AM

## 2015-05-25 NOTE — Progress Notes (Signed)
Pt remains stable for discharge. See DC summary from 05/24/15

## 2015-05-26 ENCOUNTER — Other Ambulatory Visit: Payer: Self-pay | Admitting: Internal Medicine

## 2015-05-26 DIAGNOSIS — M25561 Pain in right knee: Secondary | ICD-10-CM

## 2015-05-30 ENCOUNTER — Telehealth: Payer: Self-pay | Admitting: Family Medicine

## 2015-05-30 DIAGNOSIS — M25569 Pain in unspecified knee: Secondary | ICD-10-CM

## 2015-05-30 NOTE — Telephone Encounter (Signed)
Please review. Thanks!  

## 2015-05-30 NOTE — Telephone Encounter (Signed)
Please refer [facility] for right knee pain and effusion. Prefers not to see Dr. Rosita KeaMenz

## 2015-05-30 NOTE — Telephone Encounter (Signed)
Pt's daughter Ryan Villanueva called requesting referral [facility] for knee pain.See notes and imaging from hospital stay. (Does NOT want appointment with Dr Rosita KeaMenz) Call back # [phone]

## 2015-06-02 DIAGNOSIS — R2689 Other abnormalities of gait and mobility: Secondary | ICD-10-CM | POA: Diagnosis not present

## 2015-06-02 DIAGNOSIS — F0280 Dementia in other diseases classified elsewhere without behavioral disturbance: Secondary | ICD-10-CM | POA: Diagnosis not present

## 2015-06-02 DIAGNOSIS — F0150 Vascular dementia without behavioral disturbance: Secondary | ICD-10-CM | POA: Diagnosis not present

## 2015-06-02 DIAGNOSIS — G309 Alzheimer's disease, unspecified: Secondary | ICD-10-CM | POA: Diagnosis not present

## 2015-06-04 ENCOUNTER — Encounter
Admission: RE | Admit: 2015-06-04 | Discharge: 2015-06-04 | Disposition: A | Discharge status: Home / Self Care | Payer: Commercial Managed Care - HMO | Source: Ambulatory Visit | Attending: Internal Medicine | Admitting: Internal Medicine

## 2015-06-04 DIAGNOSIS — D649 Anemia, unspecified: Secondary | ICD-10-CM | POA: Insufficient documentation

## 2015-06-04 DIAGNOSIS — I10 Essential (primary) hypertension: Secondary | ICD-10-CM | POA: Insufficient documentation

## 2015-06-04 DIAGNOSIS — E875 Hyperkalemia: Secondary | ICD-10-CM | POA: Insufficient documentation

## 2015-06-06 DIAGNOSIS — M25569 Pain in unspecified knee: Secondary | ICD-10-CM | POA: Diagnosis not present

## 2015-06-06 DIAGNOSIS — F0390 Unspecified dementia without behavioral disturbance: Secondary | ICD-10-CM | POA: Diagnosis not present

## 2015-06-07 ENCOUNTER — Ambulatory Visit (INDEPENDENT_AMBULATORY_CARE_PROVIDER_SITE_OTHER): Payer: Commercial Managed Care - HMO | Admitting: Physician Assistant

## 2015-06-07 ENCOUNTER — Other Ambulatory Visit
Admission: RE | Admit: 2015-06-07 | Discharge: 2015-06-07 | Disposition: A | Discharge status: Home / Self Care | Payer: Commercial Managed Care - HMO | Source: Ambulatory Visit | Attending: Physician Assistant | Admitting: Physician Assistant

## 2015-06-07 ENCOUNTER — Ambulatory Visit: Payer: Commercial Managed Care - HMO | Admitting: Cardiology

## 2015-06-07 ENCOUNTER — Encounter: Payer: Self-pay | Admitting: Physician Assistant

## 2015-06-07 VITALS — BP 124/40 | HR 45 | Ht 67.0 in | Wt 159.4 lb

## 2015-06-07 DIAGNOSIS — I2510 Atherosclerotic heart disease of native coronary artery without angina pectoris: Secondary | ICD-10-CM

## 2015-06-07 DIAGNOSIS — I484 Atypical atrial flutter: Secondary | ICD-10-CM

## 2015-06-07 DIAGNOSIS — N183 Chronic kidney disease, stage 3 unspecified: Secondary | ICD-10-CM

## 2015-06-07 DIAGNOSIS — M25561 Pain in right knee: Secondary | ICD-10-CM | POA: Diagnosis not present

## 2015-06-07 DIAGNOSIS — F0390 Unspecified dementia without behavioral disturbance: Secondary | ICD-10-CM

## 2015-06-07 DIAGNOSIS — M25461 Effusion, right knee: Secondary | ICD-10-CM

## 2015-06-07 LAB — BASIC METABOLIC PANEL
ANION GAP: 12 (ref 5–15)
BUN: 52 mg/dL — AB (ref 6–20)
CHLORIDE: 100 mmol/L — AB (ref 101–111)
CO2: 23 mmol/L (ref 22–32)
Calcium: 9 mg/dL (ref 8.9–10.3)
Creatinine, Ser: 1.53 mg/dL — ABNORMAL HIGH (ref 0.61–1.24)
GFR calc Af Amer: 47 mL/min — ABNORMAL LOW (ref >60)
GFR, EST NON AFRICAN AMERICAN: 41 mL/min — AB (ref >60)
Glucose, Bld: 90 mg/dL (ref 65–99)
POTASSIUM: 5.3 mmol/L — AB (ref 3.5–5.1)
SODIUM: 135 mmol/L (ref 135–145)

## 2015-06-07 LAB — CBC WITH DIFFERENTIAL/PLATELET
BASOS ABS: 0.1 10*3/uL (ref 0–0.1)
Basophils Relative: 0 %
Eosinophils Absolute: 0.2 10*3/uL (ref 0–0.7)
Eosinophils Relative: 2 %
HEMATOCRIT: 37.9 % — AB (ref 40.0–52.0)
HEMOGLOBIN: 11.9 g/dL — AB (ref 13.0–18.0)
LYMPHS ABS: 1.1 10*3/uL (ref 1.0–3.6)
LYMPHS PCT: 9 %
MCH: 24.6 pg — AB (ref 26.0–34.0)
MCHC: 31.3 g/dL — ABNORMAL LOW (ref 32.0–36.0)
MCV: 78.6 fL — AB (ref 80.0–100.0)
Monocytes Absolute: 0.6 10*3/uL (ref 0.2–1.0)
Monocytes Relative: 5 %
NEUTROS ABS: 10.5 10*3/uL — AB (ref 1.4–6.5)
Neutrophils Relative %: 84 %
Platelets: 675 10*3/uL — ABNORMAL HIGH (ref 150–440)
RBC: 4.82 MIL/uL (ref 4.40–5.90)
RDW: 20.6 % — ABNORMAL HIGH (ref 11.5–14.5)
WBC: 12.4 10*3/uL — AB (ref 3.8–10.6)

## 2015-06-07 NOTE — Patient Instructions (Signed)
Medication Instructions:  Your physician recommends that you continue on your current medications as directed. Please refer to the Current Medication list given to you today.   Labwork: Labs today BMP and CBC  Testing/Procedures: None Ordered  Follow-Up: Your physician recommends that you schedule a follow-up appointment in: 1 month   Date & Time: _________________________________________________________________   Any Other Special Instructions Will Be Listed Below (If Applicable).     If you need a refill on your cardiac medications before your next appointment, please call your pharmacy.

## 2015-06-07 NOTE — Progress Notes (Signed)
Cardiology Office Note Date:  06/07/2015  Patient ID:  Ryan Villanueva, Ryan Villanueva 19-Jan-1931, MRN [id number] PCP:  Mila Merry, MD  Cardiologist:  Dr. Alvino Chapel, MD    Chief Complaint: Hospital follow up for atrial flutter  History of Present Illness: Ryan Villanueva is a 82 y.o. male with history of CAD s/p 3 vessel CABG in 1990 at [facility], atypical atrial flutter, stroke previously on Xarelto, prior alcohol abuse, Alzheimer's dementia, HTN, recent fall vs syncope who presents for hospital follow up for atrial flutter.   Patient has not had any ischemic evaluations since his CABG. He initially presented to [facility] ED s/p fall vs syncope at home on 3/18 leading to contusion and effusion of his right knee. In the ED he was noted to have HR in the 40's, initially felt to be in CHB  With RBBB with HR of 45 bpm. He was transferred to [facility] for evaluation and possible PMM implantation. However, upon arrival at [facility] and review of strips and EKG it was determined the patient was never in CHB. He was in atrial flutter with variable AV conduction with bradycardic rates. He had been in this rhythm since at least September and had been stable. Echo showed EF 50%, mild inferior and inferolateral HK, trivial MR, PASP 38 mm Hg. On telemetry he had no bradyarrhythmias. He was seen by neurology who advised outpatient work up for dementia. X ray of his right knee showed large suprapatellar effusion. Because of this he was not started on long term, full-dose anticoagulation. Orthopedics advised conservative management and PT. He has an appointment with ortho on 4/6.   Since his discharge he has been at [facility]. He has been working with PT, still has no extension of the right lower extremity at the knee. His right knee effusion is improved, though still remains. He otherwise feels well and has no complaints. No further falls/syncopal episodes.     Past Medical History  Diagnosis Date  . CAD in native artery 03/01/2008    h/o 3V CABG  1990 at [facility]  . Abnormal prostate specific antigen 03/30/2008    Normal prostate biopsy Dr. Jamelle Rushing 02/16/2013. Chronic and acutely inflamed prostate tissue.    Marland Kitchen Cerebral infarct (HCC) 10/07/2013    dysarthria, arm weakness  . Hypertension   . Early onset Alzheimer's dementia   . Atrial flutter [facility])     Past Surgical History  Procedure Laterality Date  . History of coronary artery bypass graft  1990    x3 [facility]  . Coronary artery bypass graft      Current Outpatient Prescriptions  Medication Sig Dispense Refill  . acetaminophen (TYLENOL) 325 MG tablet Take 650 mg by mouth every 6 (six) hours as needed.    Marland Kitchen amLODipine (NORVASC) 10 MG tablet TAKE 1 TABLET EVERY DAY 90 tablet 3  . atorvastatin (LIPITOR) 40 MG tablet Take 1 tablet (40 mg total) by mouth daily. 90 tablet 4  . hydrochlorothiazide (HYDRODIURIL) 25 MG tablet Take 25 mg by mouth daily.    Marland Kitchen lisinopril (PRINIVIL,ZESTRIL) 20 MG tablet Take 20 mg by mouth daily.    . MULTIPLE VITAMIN PO Take 1 tablet by mouth daily.    . vitamin B-12 (CYANOCOBALAMIN) 1000 MCG tablet Take 1 tablet by mouth daily.    Marland Kitchen zinc oxide 20 % ointment Apply 1 application topically as needed for irritation.     No current facility-administered medications for this visit.    Allergies:   Review of patient's  allergies indicates no known allergies.   Social History:  The patient  reports that he has quit smoking. His smoking use included Cigarettes. He quit after 30 years of use. He does not have any smokeless tobacco history on file. He reports that he does not drink alcohol or use illicit drugs.   Family History:  The patient's family history is not on file.  ROS:   Review of Systems  Constitutional: Positive for malaise/fatigue. Negative for fever, chills, weight loss and diaphoresis.  HENT: Negative for congestion.   Eyes: Negative for discharge.  Respiratory: Negative for cough, hemoptysis, sputum production, shortness of breath and  wheezing.   Cardiovascular: Positive for leg swelling. Negative for chest pain, palpitations, orthopnea, claudication and PND.       Right knee swelling with effusion   Gastrointestinal: Negative for nausea and vomiting.  Musculoskeletal: Positive for joint pain. Negative for myalgias, back pain, falls and neck pain.       Right knee pain  Skin: Negative for rash.  Neurological: Positive for weakness. Negative for dizziness, tingling, tremors, sensory change, speech change, focal weakness and loss of consciousness.  Endo/Heme/Allergies: Does not bruise/bleed easily.  Psychiatric/Behavioral: The patient is not nervous/anxious.   All other systems reviewed and are negative.     PHYSICAL EXAM:  VS:  BP 124/40 mmHg  Pulse 45  Ht  (1.702 m)  Wt 159 lb 6 oz (72.292 kg)  BMI 24.96 kg/m2 BMI: Body mass index is 24.96 kg/(m^2). Well nourished, well developed, in no acute distress HEENT: normocephalic, atraumatic Neck: no JVD, carotid bruits or masses Cardiac:  Bradycardic, irregular, S1, S2; RRR; I/VI systolic murmurs at the apex, no rubs, or gallops Lungs:  clear to auscultation bilaterally, no wheezing, rhonchi or rales Abd: soft, nontender, no hepatomegaly, + BS MS: no deformity or atrophy Ext: right knee effusion with superficial abrasion. Patient unable to fully extend right lower extremity. TTP along medial and lateral joint lines. Otherwise no edema bilaterally  Skin: warm and dry, no rash Neuro:  moves all extremities spontaneously, no focal abnormalities noted, follows commands Psych: euthymic mood, full affect   EKG:  Was ordered today. Shows atrial flutter with variable AV conduction and marked bradycardic rate of 45 bpm, RBBB, left axis deviation, old anterior infarct  Recent Labs: 05/21/2015: ALT 14*; TSH 2.684 05/22/2015: B Natriuretic Peptide 1377.1* 05/23/2015: BUN 31*; Creatinine, Ser 1.59*; Hemoglobin 11.2*; Platelets 332; Potassium 4.2; Sodium 139  03/28/2015:  Chol/HDL Ratio 4.4; Cholesterol, Total 159; HDL 36*; LDL Calculated 100*; Triglycerides 117   Estimated Creatinine Clearance: 33.5 mL/min (by C-G formula based on Cr of 1.59).   Wt Readings from Last 3 Encounters:  06/07/15 159 lb 6 oz (72.292 kg)  05/25/15 162 lb 11.2 oz (73.8 kg)  05/21/15 208 lb 8 oz (94.575 kg)     Other studies reviewed: Additional studies/records reviewed today include: summarized above  ASSESSMENT AND PLAN:  1. Atypical atrial flutter: Ventricular rates are stable when compared to prior studies. No indication for PPM at this time based on stability of rhythm and asymptomatic patient. Avoid beta blocker at this time given bradycardic rates. Restart Xarelto once cleared by orthopedics as below. CHADS2VASc of 6.  2. CAD s/p CABG as above: Stable. No symptoms concerning for angina. Echo with inferior and inferolateral HK. Consider stress testing if symptomatic. No on aspirin given he was previously on Xarelto, which will be restarted as below when able. Not on beta blocker at this time given his  marked bradycardic rates.   3. Right knee pain/suprapatellar effusion: Has appointment with orthopedics 4/6. Continues to work with PT. His continued knee pain with effusion is limiting further treatment of his atrial flutter. Because of the effusion he is not on long term, full-dose anticoagulation for #1. Once he has been seen by orthopedics and cleared by them to start Xarelto would restart this medication.    4. Dementia: Stable. Per PCP.   5. HTN: Well controlled. Continue current medications.   6. CKD stage III: Check bmet. Avoid nephrotoxins.   Disposition: F/u with Dr. Alvino ChapelIngal in 1 month  Current medicines are reviewed at length with the patient today.  The patient did not have any concerns regarding medicines.   Elinor DodgeSigned, Srinika Delone PA-C 06/07/2015 4:07 PM     [facility] [address] [place], [place] [address] [phone]

## 2015-06-09 DIAGNOSIS — M25561 Pain in right knee: Secondary | ICD-10-CM | POA: Diagnosis not present

## 2015-06-09 DIAGNOSIS — M2391 Unspecified internal derangement of right knee: Secondary | ICD-10-CM | POA: Diagnosis not present

## 2015-06-10 ENCOUNTER — Telehealth: Payer: Self-pay | Admitting: Physician Assistant

## 2015-06-10 DIAGNOSIS — I10 Essential (primary) hypertension: Secondary | ICD-10-CM | POA: Diagnosis not present

## 2015-06-10 DIAGNOSIS — E875 Hyperkalemia: Secondary | ICD-10-CM | POA: Diagnosis not present

## 2015-06-10 DIAGNOSIS — D649 Anemia, unspecified: Secondary | ICD-10-CM | POA: Diagnosis not present

## 2015-06-10 LAB — CBC WITH DIFFERENTIAL/PLATELET
BASOS ABS: 0 10*3/uL (ref 0–0.1)
Basophils Relative: 0 %
EOS PCT: 0 %
Eosinophils Absolute: 0 10*3/uL (ref 0–0.7)
HCT: 33.6 % — ABNORMAL LOW (ref 40.0–52.0)
HEMOGLOBIN: 10.6 g/dL — AB (ref 13.0–18.0)
LYMPHS ABS: 0.8 10*3/uL — AB (ref 1.0–3.6)
LYMPHS PCT: 11 %
MCH: 24.2 pg — ABNORMAL LOW (ref 26.0–34.0)
MCHC: 31.5 g/dL — ABNORMAL LOW (ref 32.0–36.0)
MCV: 76.9 fL — AB (ref 80.0–100.0)
Monocytes Absolute: 0.2 10*3/uL (ref 0.2–1.0)
Monocytes Relative: 3 %
NEUTROS PCT: 86 %
Neutro Abs: 6.6 10*3/uL — ABNORMAL HIGH (ref 1.4–6.5)
PLATELETS: 536 10*3/uL — AB (ref 150–440)
RBC: 4.37 MIL/uL — AB (ref 4.40–5.90)
RDW: 20.3 % — ABNORMAL HIGH (ref 11.5–14.5)
WBC: 7.7 10*3/uL (ref 3.8–10.6)

## 2015-06-10 LAB — COMPREHENSIVE METABOLIC PANEL
ALK PHOS: 145 U/L — AB (ref 38–126)
ALT: 21 U/L (ref 17–63)
ANION GAP: 7 (ref 5–15)
AST: 18 U/L (ref 15–41)
Albumin: 3 g/dL — ABNORMAL LOW (ref 3.5–5.0)
BUN: 61 mg/dL — ABNORMAL HIGH (ref 6–20)
CO2: 25 mmol/L (ref 22–32)
Calcium: 8.8 mg/dL — ABNORMAL LOW (ref 8.9–10.3)
Chloride: 103 mmol/L (ref 101–111)
Creatinine, Ser: 1.92 mg/dL — ABNORMAL HIGH (ref 0.61–1.24)
GFR calc non Af Amer: 31 mL/min — ABNORMAL LOW (ref >60)
GFR, EST AFRICAN AMERICAN: 36 mL/min — AB (ref >60)
GLUCOSE: 134 mg/dL — AB (ref 65–99)
POTASSIUM: 5.3 mmol/L — AB (ref 3.5–5.1)
SODIUM: 135 mmol/L (ref 135–145)
TOTAL PROTEIN: 6.7 g/dL (ref 6.5–8.1)
Total Bilirubin: 0.4 mg/dL (ref 0.3–1.2)

## 2015-06-10 LAB — TSH: TSH: 1.591 u[IU]/mL (ref 0.350–4.500)

## 2015-06-10 NOTE — Telephone Encounter (Signed)
Faxed order to [facility], [phone]

## 2015-06-10 NOTE — Telephone Encounter (Signed)
Patient daughter stated that she notified [facility] of med changes ( see lab note ) to hold lisinopril and they are not able to do this without an order from md office .  Please fax order to [phone].

## 2015-06-13 DIAGNOSIS — I10 Essential (primary) hypertension: Secondary | ICD-10-CM | POA: Diagnosis not present

## 2015-06-13 DIAGNOSIS — D649 Anemia, unspecified: Secondary | ICD-10-CM | POA: Diagnosis not present

## 2015-06-13 DIAGNOSIS — E875 Hyperkalemia: Secondary | ICD-10-CM | POA: Diagnosis not present

## 2015-06-13 LAB — COMPREHENSIVE METABOLIC PANEL
ALK PHOS: 137 U/L — AB (ref 38–126)
ALT: 44 U/L (ref 17–63)
AST: 34 U/L (ref 15–41)
Albumin: 3 g/dL — ABNORMAL LOW (ref 3.5–5.0)
Anion gap: 6 (ref 5–15)
BUN: 82 mg/dL — AB (ref 6–20)
CALCIUM: 8.5 mg/dL — AB (ref 8.9–10.3)
CO2: 26 mmol/L (ref 22–32)
CREATININE: 1.57 mg/dL — AB (ref 0.61–1.24)
Chloride: 105 mmol/L (ref 101–111)
GFR, EST AFRICAN AMERICAN: 46 mL/min — AB (ref >60)
GFR, EST NON AFRICAN AMERICAN: 39 mL/min — AB (ref >60)
Glucose, Bld: 97 mg/dL (ref 65–99)
Potassium: 5.4 mmol/L — ABNORMAL HIGH (ref 3.5–5.1)
SODIUM: 137 mmol/L (ref 135–145)
Total Bilirubin: 0.1 mg/dL — ABNORMAL LOW (ref 0.3–1.2)
Total Protein: 6.4 g/dL — ABNORMAL LOW (ref 6.5–8.1)

## 2015-06-13 LAB — CBC WITH DIFFERENTIAL/PLATELET
BASOS ABS: 0 10*3/uL (ref 0–0.1)
Basophils Relative: 0 %
Eosinophils Absolute: 0 10*3/uL (ref 0–0.7)
Eosinophils Relative: 0 %
HCT: 32.9 % — ABNORMAL LOW (ref 40.0–52.0)
HEMOGLOBIN: 10.7 g/dL — AB (ref 13.0–18.0)
LYMPHS ABS: 0.9 10*3/uL — AB (ref 1.0–3.6)
LYMPHS PCT: 9 %
MCH: 24.7 pg — AB (ref 26.0–34.0)
MCHC: 32.5 g/dL (ref 32.0–36.0)
MCV: 76.1 fL — AB (ref 80.0–100.0)
Monocytes Absolute: 0.5 10*3/uL (ref 0.2–1.0)
Monocytes Relative: 5 %
NEUTROS PCT: 86 %
Neutro Abs: 8.6 10*3/uL — ABNORMAL HIGH (ref 1.4–6.5)
Platelets: 485 10*3/uL — ABNORMAL HIGH (ref 150–440)
RBC: 4.32 MIL/uL — AB (ref 4.40–5.90)
RDW: 20 % — ABNORMAL HIGH (ref 11.5–14.5)
WBC: 10 10*3/uL (ref 3.8–10.6)

## 2015-06-14 DIAGNOSIS — D649 Anemia, unspecified: Secondary | ICD-10-CM | POA: Diagnosis not present

## 2015-06-14 DIAGNOSIS — I10 Essential (primary) hypertension: Secondary | ICD-10-CM | POA: Diagnosis not present

## 2015-06-14 DIAGNOSIS — E875 Hyperkalemia: Secondary | ICD-10-CM | POA: Diagnosis not present

## 2015-06-14 LAB — BASIC METABOLIC PANEL
Anion gap: 7 (ref 5–15)
BUN: 73 mg/dL — AB (ref 6–20)
CHLORIDE: 105 mmol/L (ref 101–111)
CO2: 24 mmol/L (ref 22–32)
CREATININE: 1.51 mg/dL — AB (ref 0.61–1.24)
Calcium: 8.5 mg/dL — ABNORMAL LOW (ref 8.9–10.3)
GFR calc Af Amer: 48 mL/min — ABNORMAL LOW (ref >60)
GFR calc non Af Amer: 41 mL/min — ABNORMAL LOW (ref >60)
Glucose, Bld: 95 mg/dL (ref 65–99)
POTASSIUM: 4.9 mmol/L (ref 3.5–5.1)
Sodium: 136 mmol/L (ref 135–145)

## 2015-06-14 LAB — OCCULT BLOOD X 1 CARD TO LAB, STOOL: Fecal Occult Bld: NEGATIVE

## 2015-06-17 ENCOUNTER — Ambulatory Visit
Admission: RE | Admit: 2015-06-17 | Discharge: 2015-06-17 | Disposition: A | Discharge status: Home / Self Care | Payer: Commercial Managed Care - HMO | Source: Ambulatory Visit | Attending: Internal Medicine | Admitting: Internal Medicine

## 2015-06-17 DIAGNOSIS — M25561 Pain in right knee: Secondary | ICD-10-CM | POA: Diagnosis not present

## 2015-06-17 DIAGNOSIS — M659 Synovitis and tenosynovitis, unspecified: Secondary | ICD-10-CM | POA: Insufficient documentation

## 2015-06-17 DIAGNOSIS — S83241A Other tear of medial meniscus, current injury, right knee, initial encounter: Secondary | ICD-10-CM | POA: Diagnosis not present

## 2015-06-17 DIAGNOSIS — X58XXXA Exposure to other specified factors, initial encounter: Secondary | ICD-10-CM | POA: Insufficient documentation

## 2015-06-17 DIAGNOSIS — M25461 Effusion, right knee: Secondary | ICD-10-CM | POA: Insufficient documentation

## 2015-06-21 ENCOUNTER — Ambulatory Visit: Payer: Self-pay | Admitting: Neurology

## 2015-06-27 ENCOUNTER — Telehealth: Payer: Self-pay | Admitting: Family Medicine

## 2015-06-27 DIAGNOSIS — M109 Gout, unspecified: Secondary | ICD-10-CM | POA: Diagnosis not present

## 2015-06-27 DIAGNOSIS — F1021 Alcohol dependence, in remission: Secondary | ICD-10-CM | POA: Diagnosis not present

## 2015-06-27 DIAGNOSIS — I10 Essential (primary) hypertension: Secondary | ICD-10-CM | POA: Diagnosis not present

## 2015-06-27 DIAGNOSIS — E785 Hyperlipidemia, unspecified: Secondary | ICD-10-CM | POA: Diagnosis not present

## 2015-06-27 DIAGNOSIS — I2510 Atherosclerotic heart disease of native coronary artery without angina pectoris: Secondary | ICD-10-CM | POA: Diagnosis not present

## 2015-06-27 DIAGNOSIS — F0150 Vascular dementia without behavioral disturbance: Secondary | ICD-10-CM | POA: Diagnosis not present

## 2015-06-27 DIAGNOSIS — R2681 Unsteadiness on feet: Secondary | ICD-10-CM | POA: Diagnosis not present

## 2015-06-27 DIAGNOSIS — Z9181 History of falling: Secondary | ICD-10-CM | POA: Diagnosis not present

## 2015-06-27 DIAGNOSIS — I4892 Unspecified atrial flutter: Secondary | ICD-10-CM | POA: Diagnosis not present

## 2015-06-27 NOTE — Telephone Encounter (Signed)
Ryan Villanueva with [facility] Physical therapy is requesting a verbal order for nursing to evaluation and treat a stage 2 pressure sore on pt bottom.  CB#[phone]/MW

## 2015-06-27 NOTE — Telephone Encounter (Signed)
Ryan Villanueva was given verbal order.

## 2015-06-27 NOTE — Telephone Encounter (Signed)
That's fine

## 2015-06-29 DIAGNOSIS — Z9181 History of falling: Secondary | ICD-10-CM | POA: Diagnosis not present

## 2015-06-29 DIAGNOSIS — I10 Essential (primary) hypertension: Secondary | ICD-10-CM | POA: Diagnosis not present

## 2015-06-29 DIAGNOSIS — M109 Gout, unspecified: Secondary | ICD-10-CM | POA: Diagnosis not present

## 2015-06-29 DIAGNOSIS — R2681 Unsteadiness on feet: Secondary | ICD-10-CM | POA: Diagnosis not present

## 2015-06-29 DIAGNOSIS — F0150 Vascular dementia without behavioral disturbance: Secondary | ICD-10-CM | POA: Diagnosis not present

## 2015-06-29 DIAGNOSIS — I2510 Atherosclerotic heart disease of native coronary artery without angina pectoris: Secondary | ICD-10-CM | POA: Diagnosis not present

## 2015-06-29 DIAGNOSIS — F1021 Alcohol dependence, in remission: Secondary | ICD-10-CM | POA: Diagnosis not present

## 2015-06-29 DIAGNOSIS — I4892 Unspecified atrial flutter: Secondary | ICD-10-CM | POA: Diagnosis not present

## 2015-06-29 DIAGNOSIS — E785 Hyperlipidemia, unspecified: Secondary | ICD-10-CM | POA: Diagnosis not present

## 2015-06-30 DIAGNOSIS — M109 Gout, unspecified: Secondary | ICD-10-CM | POA: Diagnosis not present

## 2015-06-30 DIAGNOSIS — Z9181 History of falling: Secondary | ICD-10-CM | POA: Diagnosis not present

## 2015-06-30 DIAGNOSIS — I4892 Unspecified atrial flutter: Secondary | ICD-10-CM | POA: Diagnosis not present

## 2015-06-30 DIAGNOSIS — R2681 Unsteadiness on feet: Secondary | ICD-10-CM | POA: Diagnosis not present

## 2015-06-30 DIAGNOSIS — E785 Hyperlipidemia, unspecified: Secondary | ICD-10-CM | POA: Diagnosis not present

## 2015-06-30 DIAGNOSIS — F1021 Alcohol dependence, in remission: Secondary | ICD-10-CM | POA: Diagnosis not present

## 2015-06-30 DIAGNOSIS — F0150 Vascular dementia without behavioral disturbance: Secondary | ICD-10-CM | POA: Diagnosis not present

## 2015-06-30 DIAGNOSIS — I2510 Atherosclerotic heart disease of native coronary artery without angina pectoris: Secondary | ICD-10-CM | POA: Diagnosis not present

## 2015-06-30 DIAGNOSIS — I10 Essential (primary) hypertension: Secondary | ICD-10-CM | POA: Diagnosis not present

## 2015-07-04 DIAGNOSIS — M109 Gout, unspecified: Secondary | ICD-10-CM | POA: Diagnosis not present

## 2015-07-04 DIAGNOSIS — R2681 Unsteadiness on feet: Secondary | ICD-10-CM | POA: Diagnosis not present

## 2015-07-04 DIAGNOSIS — Z9181 History of falling: Secondary | ICD-10-CM | POA: Diagnosis not present

## 2015-07-04 DIAGNOSIS — I4892 Unspecified atrial flutter: Secondary | ICD-10-CM | POA: Diagnosis not present

## 2015-07-04 DIAGNOSIS — F1021 Alcohol dependence, in remission: Secondary | ICD-10-CM | POA: Diagnosis not present

## 2015-07-04 DIAGNOSIS — E785 Hyperlipidemia, unspecified: Secondary | ICD-10-CM | POA: Diagnosis not present

## 2015-07-04 DIAGNOSIS — I2510 Atherosclerotic heart disease of native coronary artery without angina pectoris: Secondary | ICD-10-CM | POA: Diagnosis not present

## 2015-07-04 DIAGNOSIS — I10 Essential (primary) hypertension: Secondary | ICD-10-CM | POA: Diagnosis not present

## 2015-07-04 DIAGNOSIS — F0150 Vascular dementia without behavioral disturbance: Secondary | ICD-10-CM | POA: Diagnosis not present

## 2015-07-06 DIAGNOSIS — Z9181 History of falling: Secondary | ICD-10-CM | POA: Diagnosis not present

## 2015-07-06 DIAGNOSIS — E785 Hyperlipidemia, unspecified: Secondary | ICD-10-CM | POA: Diagnosis not present

## 2015-07-06 DIAGNOSIS — R2681 Unsteadiness on feet: Secondary | ICD-10-CM | POA: Diagnosis not present

## 2015-07-06 DIAGNOSIS — I10 Essential (primary) hypertension: Secondary | ICD-10-CM | POA: Diagnosis not present

## 2015-07-06 DIAGNOSIS — M109 Gout, unspecified: Secondary | ICD-10-CM | POA: Diagnosis not present

## 2015-07-06 DIAGNOSIS — F1021 Alcohol dependence, in remission: Secondary | ICD-10-CM | POA: Diagnosis not present

## 2015-07-06 DIAGNOSIS — I4892 Unspecified atrial flutter: Secondary | ICD-10-CM | POA: Diagnosis not present

## 2015-07-06 DIAGNOSIS — F0150 Vascular dementia without behavioral disturbance: Secondary | ICD-10-CM | POA: Diagnosis not present

## 2015-07-06 DIAGNOSIS — I2510 Atherosclerotic heart disease of native coronary artery without angina pectoris: Secondary | ICD-10-CM | POA: Diagnosis not present

## 2015-07-07 DIAGNOSIS — I4892 Unspecified atrial flutter: Secondary | ICD-10-CM | POA: Diagnosis not present

## 2015-07-07 DIAGNOSIS — Z9181 History of falling: Secondary | ICD-10-CM | POA: Diagnosis not present

## 2015-07-07 DIAGNOSIS — R2681 Unsteadiness on feet: Secondary | ICD-10-CM | POA: Diagnosis not present

## 2015-07-07 DIAGNOSIS — F0150 Vascular dementia without behavioral disturbance: Secondary | ICD-10-CM | POA: Diagnosis not present

## 2015-07-11 DIAGNOSIS — I4892 Unspecified atrial flutter: Secondary | ICD-10-CM | POA: Diagnosis not present

## 2015-07-11 DIAGNOSIS — F0150 Vascular dementia without behavioral disturbance: Secondary | ICD-10-CM | POA: Diagnosis not present

## 2015-07-11 DIAGNOSIS — I10 Essential (primary) hypertension: Secondary | ICD-10-CM | POA: Diagnosis not present

## 2015-07-11 DIAGNOSIS — F1021 Alcohol dependence, in remission: Secondary | ICD-10-CM | POA: Diagnosis not present

## 2015-07-11 DIAGNOSIS — Z9181 History of falling: Secondary | ICD-10-CM | POA: Diagnosis not present

## 2015-07-11 DIAGNOSIS — E785 Hyperlipidemia, unspecified: Secondary | ICD-10-CM | POA: Diagnosis not present

## 2015-07-11 DIAGNOSIS — R2681 Unsteadiness on feet: Secondary | ICD-10-CM | POA: Diagnosis not present

## 2015-07-11 DIAGNOSIS — I2510 Atherosclerotic heart disease of native coronary artery without angina pectoris: Secondary | ICD-10-CM | POA: Diagnosis not present

## 2015-07-11 DIAGNOSIS — M109 Gout, unspecified: Secondary | ICD-10-CM | POA: Diagnosis not present

## 2015-07-13 DIAGNOSIS — F1021 Alcohol dependence, in remission: Secondary | ICD-10-CM | POA: Diagnosis not present

## 2015-07-13 DIAGNOSIS — R2681 Unsteadiness on feet: Secondary | ICD-10-CM | POA: Diagnosis not present

## 2015-07-13 DIAGNOSIS — E785 Hyperlipidemia, unspecified: Secondary | ICD-10-CM | POA: Diagnosis not present

## 2015-07-13 DIAGNOSIS — M109 Gout, unspecified: Secondary | ICD-10-CM | POA: Diagnosis not present

## 2015-07-13 DIAGNOSIS — Z9181 History of falling: Secondary | ICD-10-CM | POA: Diagnosis not present

## 2015-07-13 DIAGNOSIS — I10 Essential (primary) hypertension: Secondary | ICD-10-CM | POA: Diagnosis not present

## 2015-07-13 DIAGNOSIS — I2510 Atherosclerotic heart disease of native coronary artery without angina pectoris: Secondary | ICD-10-CM | POA: Diagnosis not present

## 2015-07-13 DIAGNOSIS — F0150 Vascular dementia without behavioral disturbance: Secondary | ICD-10-CM | POA: Diagnosis not present

## 2015-07-13 DIAGNOSIS — I4892 Unspecified atrial flutter: Secondary | ICD-10-CM | POA: Diagnosis not present

## 2015-07-15 ENCOUNTER — Ambulatory Visit: Payer: Commercial Managed Care - HMO | Admitting: Cardiovascular Disease

## 2015-07-15 DIAGNOSIS — I2510 Atherosclerotic heart disease of native coronary artery without angina pectoris: Secondary | ICD-10-CM | POA: Diagnosis not present

## 2015-07-15 DIAGNOSIS — F0150 Vascular dementia without behavioral disturbance: Secondary | ICD-10-CM | POA: Diagnosis not present

## 2015-07-15 DIAGNOSIS — I10 Essential (primary) hypertension: Secondary | ICD-10-CM | POA: Diagnosis not present

## 2015-07-15 DIAGNOSIS — Z9181 History of falling: Secondary | ICD-10-CM | POA: Diagnosis not present

## 2015-07-15 DIAGNOSIS — F1021 Alcohol dependence, in remission: Secondary | ICD-10-CM | POA: Diagnosis not present

## 2015-07-15 DIAGNOSIS — E785 Hyperlipidemia, unspecified: Secondary | ICD-10-CM | POA: Diagnosis not present

## 2015-07-15 DIAGNOSIS — I4892 Unspecified atrial flutter: Secondary | ICD-10-CM | POA: Diagnosis not present

## 2015-07-15 DIAGNOSIS — R2681 Unsteadiness on feet: Secondary | ICD-10-CM | POA: Diagnosis not present

## 2015-07-15 DIAGNOSIS — M109 Gout, unspecified: Secondary | ICD-10-CM | POA: Diagnosis not present

## 2015-08-16 ENCOUNTER — Encounter: Payer: Self-pay | Admitting: Cardiovascular Disease

## 2015-08-16 ENCOUNTER — Ambulatory Visit (INDEPENDENT_AMBULATORY_CARE_PROVIDER_SITE_OTHER): Payer: Commercial Managed Care - HMO | Admitting: Cardiovascular Disease

## 2015-08-16 VITALS — BP 150/60 | HR 52 | Ht 67.0 in | Wt 173.4 lb

## 2015-08-16 DIAGNOSIS — I2510 Atherosclerotic heart disease of native coronary artery without angina pectoris: Secondary | ICD-10-CM | POA: Diagnosis not present

## 2015-08-16 DIAGNOSIS — I484 Atypical atrial flutter: Secondary | ICD-10-CM

## 2015-08-16 MED ORDER — RIVAROXABAN 15 MG PO TABS: 15.0000 mg | ORAL_TABLET | Freq: Every day | ORAL | Status: DC

## 2015-08-16 NOTE — Patient Instructions (Signed)
Medication Instructions:  Your physician has recommended you make the following change in your medication:  START taking xarelto 15mg  once daily   Labwork: none  Testing/Procedures: none  Follow-Up: Your physician recommends that you schedule a follow-up appointment in: 3 months with [name] Dunn, PA-C   Any Other Special Instructions Will Be Listed Below (If Applicable).     If you need a refill on your cardiac medications before your next appointment, please call your pharmacy.

## 2015-08-16 NOTE — Progress Notes (Signed)
Cardiology Office Note   Date:  08/16/2015   ID:  Ryan Villanueva, DOB [birth year], MRN [id number]  PCP:  Ryan Merry, MD  Cardiologist:   Ryan Bears, MD   Chief Complaint  Patient presents with  . other    1 month F/U.  Medications verbally reviewed with patient.       History of Present Illness: Ryan Villanueva is a 82 y.o. male who presents for a follow-up visit regarding paroxysmal atrial flutter and coronary artery disease. He has known history of CAD s/p 3 vessel CABG in 1990 at [facility], atypical atrial flutter, stroke previously on Xarelto, prior alcohol abuse, Alzheimer's dementia and hypertension.  He was hospitalized in March after a fall with questionable syncope . This led to contusion and effusion of his right knee.  In the ED he was noted to have HR in the 40's, initially felt to be in CHB  With RBBB with HR of 45 bpm. He was transferred to [facility] for evaluation and possible PMM implantation. However, upon arrival at [facility] and review of strips and EKG it was determined the patient was never in CHB. He was in atrial flutter with variable AV conduction with bradycardic rates.  Echo showed EF 50%, mild inferior and inferolateral HK, trivial MR, PASP 38 mm Hg. On telemetry he had no bradyarrhythmias.  Xarelto was held until his right knee improves. He was treated conservatively by orthopedics. He denies any chest pain, shortness of breath or dizziness. He claims that he did not pass out during his fall in March but given his dementia, it's difficult to get an accurate history. His knee healed completely without intervention.    Past Medical History  Diagnosis Date  . CAD in native artery 03/01/2008    h/o 3V CABG 1990 at [facility]  . Abnormal prostate specific antigen 03/30/2008    Normal prostate biopsy [facility]. Jam[facility] 02/16/2013. Chronic and acutely inflamed prostate tissue.    Marland Kitchen Cerebral infarct (HCC) 10/07/2013    dysarthria, arm weakness  . Hypertension   . Early onset Alzheimer's  dementia   . Atrial flutter [facility])     Past Surgical History  Procedure Laterality Date  . History of coronary artery bypass graft  1990    x3 Surgeon [facility]  . Coronary artery bypass graft       Current Outpatient Prescriptions  Medication Sig Dispense Refill  . acetaminophen (TYLENOL) 325 MG tablet Take 650 mg by mouth every 6 (six) hours as needed.    Marland Kitchen amLODipine (NORVASC) 10 MG tablet TAKE 1 TABLET EVERY DAY 90 tablet 3  . atorvastatin (LIPITOR) 40 MG tablet Take 1 tablet (40 mg total) by mouth daily. 90 tablet 4  . hydrochlorothiazide (HYDRODIURIL) 25 MG tablet Take 25 mg by mouth daily.    Marland Kitchen lisinopril (PRINIVIL,ZESTRIL) 20 MG tablet Take 20 mg by mouth daily.    . MULTIPLE VITAMIN PO Take 1 tablet by mouth daily.    . vitamin B-12 (CYANOCOBALAMIN) 1000 MCG tablet Take 1 tablet by mouth daily.    Marland Kitchen zinc oxide 20 % ointment Apply 1 application topically as needed for irritation.    . Rivaroxaban (XARELTO) 15 MG TABS tablet Take 1 tablet (15 mg total) by mouth daily with supper. 30 tablet 3   No current facility-administered medications for this visit.    Allergies:   Review of patient's allergies indicates no known allergies.    Social History:  The patient  reports that he has  quit smoking. His smoking use included Cigarettes. He quit after 30 years of use. He does not have any smokeless tobacco history on file. He reports that he does not drink alcohol or use illicit drugs.   Family History:  The patient's  family history is not on file.    ROS:  Please see the history of present illness.   Otherwise, review of systems are positive for none.   All other systems are reviewed and negative.    PHYSICAL EXAM: VS:  BP 150/60 mmHg  Pulse 52  Ht 5\' 7"  (1.702 m)  Wt 173 lb 6.4 oz (78.654 kg)  BMI 27.15 kg/m2 , BMI Body mass index is 27.15 kg/(m^2). GEN: Well nourished, well developed, in no acute distress HEENT: normal Neck: no JVD, carotid bruits, or masses Cardiac: RRR;  no murmurs, rubs, or gallops,no edema  Respiratory:  clear to auscultation bilaterally, normal work of breathing GI: soft, nontender, nondistended, + BS MS: no deformity or atrophy Skin: warm and dry, no rash Neuro:  Strength and sensation are intact Psych: euthymic mood, full affect   EKG:  EKG is ordered today. The ekg ordered today demonstrates atrial tachycardia with 321 AV block, right bundle branch block and possible old inferior infarct.  Recent Labs: 05/22/2015: B Natriuretic Peptide 1377.1* 06/10/2015: TSH 1.591 06/13/2015: ALT 44; Hemoglobin 10.7*; Platelets 485* 06/14/2015: BUN 73*; Creatinine, Ser 1.51*; Potassium 4.9; Sodium 136    Lipid Panel    Component Value Date/Time   CHOL 159 03/28/2015 0855   CHOL 135 06/10/2014   CHOL 190 10/06/2013 0429   TRIG 117 03/28/2015 0855   TRIG 85 10/06/2013 0429   HDL 36* 03/28/2015 0855   HDL 44 06/10/2014   HDL 48 10/06/2013 0429   CHOLHDL 4.4 03/28/2015 0855   VLDL 17 10/06/2013 0429   LDLCALC 100* 03/28/2015 0855   LDLCALC 73 06/10/2014   LDLCALC 125* 10/06/2013 0429      Wt Readings from Last 3 Encounters:  08/16/15 173 lb 6.4 oz (78.654 kg)  06/17/15 166 lb (75.297 kg)  06/07/15 159 lb 6 oz (72.292 kg)       ASSESSMENT AND PLAN:  1.  Paroxysmal atrial flutter: His previous EKGs did show clear evidence of atrial flutter with variable AV block. Today's EKG shows atrial tachycardia with 3:1 AV block. I'm going to resume anticoagulation with Xarelto given that his knee healed completely. His creatinine clearance is 41 and thus I elected to start him on the 15 mg dose. He does not require any rate controlling medications. He will need routine labs in the next few months.  2. Coronary artery disease status post CABG: No anginal symptoms. Continue medical therapy.  3. Essential hypertension: Blood pressure is mildly elevated. Continue to monitor.  4. Chronic kidney disease: This appears to be  stable.     Disposition:   FU with [facility]  in 3 months  Signed,  Ryan BearsMuhammad Arida, MD  08/16/2015 3:32 PM    [facility]

## 2015-08-17 ENCOUNTER — Other Ambulatory Visit: Payer: Self-pay | Admitting: *Deleted

## 2015-08-17 ENCOUNTER — Encounter: Payer: Self-pay | Admitting: Cardiovascular Disease

## 2015-08-17 ENCOUNTER — Telehealth: Payer: Self-pay | Admitting: Cardiovascular Disease

## 2015-08-17 MED ORDER — RIVAROXABAN 15 MG PO TABS: 15.0000 mg | ORAL_TABLET | Freq: Every day | ORAL | Status: DC

## 2015-08-17 MED ORDER — RIVAROXABAN 15 MG PO TABS: 15.0000 mg | ORAL_TABLET | Freq: Every day | ORAL | Status: AC

## 2015-08-17 NOTE — Telephone Encounter (Signed)
Requested Prescriptions   Signed Prescriptions Disp Refills  . Rivaroxaban (XARELTO) 15 MG TABS tablet 30 tablet 3    Sig: Take 1 tablet (15 mg total) by mouth daily with supper.    Authorizing Provider: Lorine BearsARIDA, MUHAMMAD A    Ordering User: Kendrick FriesLOPEZ, Howard Patton C

## 2015-08-17 NOTE — Telephone Encounter (Signed)
*  STAT* If patient is at the pharmacy, call can be transferred to refill team.   1. Which medications need to be refilled? (please list name of each medication and dose if known) Rivaroxaban (XARELTO) 15 MG TABS tablet   2. Which pharmacy/location (including street and city if local pharmacy) is medication to be sent to? [facility]  [address] [place]  3. Do they need a 30 day or 90 day supply? 30 day supply  Also needs to be sent to [facility]

## 2015-11-08 ENCOUNTER — Ambulatory Visit: Payer: Commercial Managed Care - HMO | Admitting: Cardiovascular Disease

## 2015-11-18 ENCOUNTER — Ambulatory Visit: Payer: Commercial Managed Care - HMO | Admitting: Cardiovascular Disease

## 2016-01-04 DIAGNOSIS — Z23 Encounter for immunization: Secondary | ICD-10-CM | POA: Diagnosis not present

## 2016-01-04 DIAGNOSIS — I4892 Unspecified atrial flutter: Secondary | ICD-10-CM | POA: Diagnosis not present

## 2016-01-04 DIAGNOSIS — I208 Other forms of angina pectoris: Secondary | ICD-10-CM | POA: Diagnosis not present

## 2016-01-04 DIAGNOSIS — I4891 Unspecified atrial fibrillation: Secondary | ICD-10-CM | POA: Diagnosis not present

## 2016-01-05 DIAGNOSIS — E784 Other hyperlipidemia: Secondary | ICD-10-CM | POA: Diagnosis not present

## 2016-01-05 DIAGNOSIS — E034 Atrophy of thyroid (acquired): Secondary | ICD-10-CM | POA: Diagnosis not present

## 2016-01-05 DIAGNOSIS — Z125 Encounter for screening for malignant neoplasm of prostate: Secondary | ICD-10-CM | POA: Diagnosis not present

## 2016-01-05 DIAGNOSIS — I10 Essential (primary) hypertension: Secondary | ICD-10-CM | POA: Diagnosis not present

## 2016-01-05 DIAGNOSIS — R5381 Other malaise: Secondary | ICD-10-CM | POA: Diagnosis not present

## 2016-01-18 DIAGNOSIS — I4891 Unspecified atrial fibrillation: Secondary | ICD-10-CM | POA: Diagnosis not present

## 2016-01-18 DIAGNOSIS — I2510 Atherosclerotic heart disease of native coronary artery without angina pectoris: Secondary | ICD-10-CM | POA: Diagnosis not present

## 2016-01-18 DIAGNOSIS — I4892 Unspecified atrial flutter: Secondary | ICD-10-CM | POA: Diagnosis not present

## 2016-01-18 DIAGNOSIS — E785 Hyperlipidemia, unspecified: Secondary | ICD-10-CM | POA: Diagnosis not present

## 2016-03-07 DIAGNOSIS — I4892 Unspecified atrial flutter: Secondary | ICD-10-CM | POA: Diagnosis not present

## 2016-03-07 DIAGNOSIS — I4891 Unspecified atrial fibrillation: Secondary | ICD-10-CM | POA: Diagnosis not present

## 2016-03-07 DIAGNOSIS — R351 Nocturia: Secondary | ICD-10-CM | POA: Diagnosis not present

## 2016-03-07 DIAGNOSIS — N401 Enlarged prostate with lower urinary tract symptoms: Secondary | ICD-10-CM | POA: Diagnosis not present

## 2016-04-04 DIAGNOSIS — I25810 Atherosclerosis of coronary artery bypass graft(s) without angina pectoris: Secondary | ICD-10-CM | POA: Diagnosis not present

## 2016-04-04 DIAGNOSIS — E785 Hyperlipidemia, unspecified: Secondary | ICD-10-CM | POA: Diagnosis not present

## 2016-04-04 DIAGNOSIS — I4891 Unspecified atrial fibrillation: Secondary | ICD-10-CM | POA: Diagnosis not present

## 2016-04-04 DIAGNOSIS — I2510 Atherosclerotic heart disease of native coronary artery without angina pectoris: Secondary | ICD-10-CM | POA: Diagnosis not present

## 2016-06-29 DIAGNOSIS — I4891 Unspecified atrial fibrillation: Secondary | ICD-10-CM | POA: Diagnosis not present

## 2016-06-29 DIAGNOSIS — E785 Hyperlipidemia, unspecified: Secondary | ICD-10-CM | POA: Diagnosis not present

## 2016-06-29 DIAGNOSIS — I4892 Unspecified atrial flutter: Secondary | ICD-10-CM | POA: Diagnosis not present

## 2016-06-29 DIAGNOSIS — I25810 Atherosclerosis of coronary artery bypass graft(s) without angina pectoris: Secondary | ICD-10-CM | POA: Diagnosis not present

## 2016-07-30 ENCOUNTER — Emergency Department: Payer: Medicare HMO

## 2016-07-30 ENCOUNTER — Emergency Department
Admission: EM | Admit: 2016-07-30 | Discharge: 2016-07-30 | Disposition: A | Discharge status: Home / Self Care | Payer: Medicare HMO | Attending: Emergency Medicine | Admitting: Emergency Medicine

## 2016-07-30 ENCOUNTER — Encounter: Payer: Self-pay | Admitting: Emergency Medicine

## 2016-07-30 DIAGNOSIS — M79641 Pain in right hand: Secondary | ICD-10-CM | POA: Diagnosis not present

## 2016-07-30 DIAGNOSIS — I25810 Atherosclerosis of coronary artery bypass graft(s) without angina pectoris: Secondary | ICD-10-CM | POA: Insufficient documentation

## 2016-07-30 DIAGNOSIS — F17210 Nicotine dependence, cigarettes, uncomplicated: Secondary | ICD-10-CM | POA: Diagnosis not present

## 2016-07-30 DIAGNOSIS — I10 Essential (primary) hypertension: Secondary | ICD-10-CM | POA: Insufficient documentation

## 2016-07-30 DIAGNOSIS — M7989 Other specified soft tissue disorders: Secondary | ICD-10-CM | POA: Insufficient documentation

## 2016-07-30 DIAGNOSIS — M79621 Pain in right upper arm: Secondary | ICD-10-CM | POA: Diagnosis not present

## 2016-07-30 LAB — CBC WITH DIFFERENTIAL/PLATELET
BASOS ABS: 0 10*3/uL (ref 0–0.1)
Basophils Relative: 0 %
EOS PCT: 0 %
Eosinophils Absolute: 0 10*3/uL (ref 0–0.7)
HEMATOCRIT: 34.8 % — AB (ref 40.0–52.0)
Hemoglobin: 11.3 g/dL — ABNORMAL LOW (ref 13.0–18.0)
LYMPHS ABS: 1.1 10*3/uL (ref 1.0–3.6)
Lymphocytes Relative: 11 %
MCH: 27.2 pg (ref 26.0–34.0)
MCHC: 32.5 g/dL (ref 32.0–36.0)
MCV: 83.6 fL (ref 80.0–100.0)
MONO ABS: 1 10*3/uL (ref 0.2–1.0)
MONOS PCT: 10 %
Neutro Abs: 8.3 10*3/uL — ABNORMAL HIGH (ref 1.4–6.5)
Neutrophils Relative %: 79 %
PLATELETS: 405 10*3/uL (ref 150–440)
RBC: 4.16 MIL/uL — ABNORMAL LOW (ref 4.40–5.90)
RDW: 15.3 % — AB (ref 11.5–14.5)
WBC: 10.4 10*3/uL (ref 3.8–10.6)

## 2016-07-30 LAB — BASIC METABOLIC PANEL
Anion gap: 7 (ref 5–15)
BUN: 35 mg/dL — AB (ref 6–20)
CO2: 29 mmol/L (ref 22–32)
Calcium: 8.6 mg/dL — ABNORMAL LOW (ref 8.9–10.3)
Chloride: 104 mmol/L (ref 101–111)
Creatinine, Ser: 1.53 mg/dL — ABNORMAL HIGH (ref 0.61–1.24)
GFR calc Af Amer: 47 mL/min — ABNORMAL LOW (ref >60)
GFR calc non Af Amer: 40 mL/min — ABNORMAL LOW (ref >60)
GLUCOSE: 89 mg/dL (ref 65–99)
Potassium: 4 mmol/L (ref 3.5–5.1)
Sodium: 140 mmol/L (ref 135–145)

## 2016-07-30 MED ORDER — CEPHALEXIN 500 MG PO CAPS: 500.0000 mg | ORAL_CAPSULE | Freq: Three times a day (TID) | ORAL | 0 refills | Status: AC

## 2016-07-30 NOTE — ED Notes (Signed)
Pt to ed with c/o right arm swelling that began three days ago, denies injury.  Pt states it is progressively getting worse. Denies taking blood thinners. Pt with swelling and redness noted to right arm elbow to hand. +pulse, +movement, +sensation.  Pt alert and oriented.  Pt skin warm and dry.  Pt family at bedside.

## 2016-07-30 NOTE — ED Notes (Signed)
Pt back from u/s. Family at bedside

## 2016-07-30 NOTE — ED Triage Notes (Signed)
Pt presents with right arm swelling that began three days ago and has gotten progressively worse. Pt denies injury or falls. Denies taking blood thinners. Pt wife reports pt hits arms up against door frames often but she does not recall any specific event lately. Pt with swelling and redness noted to right arm elbow to hand.

## 2016-07-30 NOTE — ED Notes (Signed)
Pt in u/s

## 2016-07-30 NOTE — Discharge Instructions (Signed)
Keep arm elevated. Take the Keflex one pill 3 times a day. Return for increased pain swelling or redness. Follow-up with your doctor or return here tomorrow for recheck.

## 2016-07-30 NOTE — ED Provider Notes (Signed)
[facility] Emergency Department Provider Note   ____________________________________________   First MD Initiated Contact with Patient 07/30/16 1200     (approximate)  I have reviewed the triage vital signs and the nursing notes.   HISTORY  Chief Complaint Arm Swelling   HPI Ryan Villanueva is a 83 y.o. male patient and family report that several days ago he had some left elbow pain and he was unable to move his elbow well without it hurting. Since then the elbow seems to have improved somewhat but his distal right arm is now swelling especially around the wrist and hand. Getting worse every day is warm and slightly red hurts there isn't any limitation of movement except for from the swelling. Patient is not running a fever does not remember injuring the wrist or hand at all. He did bang the elbow prior to it beginning to hurt.   Past Medical History:  Diagnosis Date  . Abnormal prostate specific antigen 03/30/2008   Normal prostate biopsy Dr. Jamelle Rushing 02/16/2013. Chronic and acutely inflamed prostate tissue.    . Atrial flutter (HCC)   . CAD in native artery 03/01/2008   h/o 3V CABG 1990 at [facility]  . Cerebral infarct (HCC) 10/07/2013   dysarthria, arm weakness  . Early onset Alzheimer's dementia   . Hypertension     Patient Active Problem List   Diagnosis Date Noted  . Knee pain 05/30/2015  . AV heart block 05/22/2015  . Atypical atrial flutter (HCC)   . Iron deficiency anemia 03/30/2015  . Acute renal insufficiency 03/30/2015  . H/O coronary artery bypass surgery 11/25/2014  . Late effects of CVA (cerebrovascular accident) 11/25/2014  . Atrial flutter (HCC) 11/04/2014  . Bradycardia 11/04/2014  . 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 HB (heart block) 11/04/2014  . Speech disorder 11/04/2014  . CAD in native artery 03/01/2008  . Arthritis due to gout 03/05/2005  . Essential (primary) hypertension 03/05/1988  .  Hypercholesteremia 03/05/1988    Past Surgical History:  Procedure Laterality Date  . CORONARY ARTERY BYPASS GRAFT    . History of coronary artery bypass graft  1990   x3 Surgeon [facility]    Prior to Admission medications   Medication Sig Start Date End Date Taking? Authorizing Provider  acetaminophen (TYLENOL) 325 MG tablet Take 650 mg by mouth every 6 (six) hours as needed.    [provider]  amLODipine (NORVASC) 10 MG tablet TAKE 1 TABLET EVERY DAY 11/27/14   Malva Limes, MD  atorvastatin (LIPITOR) 40 MG tablet Take 1 tablet (40 mg total) by mouth daily. 12/15/14   Malva Limes, MD  hydrochlorothiazide (HYDRODIURIL) 25 MG tablet Take 25 mg by mouth daily.    [provider]  lisinopril (PRINIVIL,ZESTRIL) 20 MG tablet Take 20 mg by mouth daily.    [provider]  MULTIPLE VITAMIN PO Take 1 tablet by mouth daily. 03/01/08   [provider]  Rivaroxaban (XARELTO) 15 MG TABS tablet Take 1 tablet (15 mg total) by mouth daily with supper. 08/17/15   Iran Ouch, MD  vitamin B-12 (CYANOCOBALAMIN) 1000 MCG tablet Take 1 tablet by mouth daily.    [provider]  zinc oxide 20 % ointment Apply 1 application topically as needed for irritation.    [provider]    Allergies Patient has no known allergies.  No family history on file.  Social History Social History  Substance Use Topics  . Smoking status:  Former Smoker    Years: 30.00    Types: Cigarettes  . Smokeless tobacco: Not on file     Comment: quit smoking in the 1990's uses smokeless tobacco  . Alcohol use No    Review of Systems  Constitutional: No fever/chills Eyes: No visual changes. ENT: No sore throat. Cardiovascular: Denies chest pain. Respiratory: Denies shortness of breath. Gastrointestinal: No abdominal pain.  No nausea, no vomiting.  No diarrhea.  No constipation. Genitourinary: Negative for dysuria. Musculoskeletal: Negative for back  pain. Skin: Negative for rash. Neurological: Negative for headaches, focal weakness or numbness.   ____________________________________________   PHYSICAL EXAM:  VITAL SIGNS: ED Triage Vitals  Enc Vitals Group     BP 07/30/16 1021 (!) 160/69     Pulse Rate 07/30/16 1021 (!) 56     Resp 07/30/16 1021 18     Temp 07/30/16 1021 98.3 F (36.8 C)     Temp Source 07/30/16 1021 Oral     SpO2 07/30/16 1021 93 %     Weight 07/30/16 1021 165 lb (74.8 kg)     Height 07/30/16 1021 5\' 7"  (1.702 m)     Head Circumference --      Peak Flow --      Pain Score 07/30/16 1132 5     Pain Loc --      Pain Edu? --      Excl. in GC? --     Constitutional: Alert and oriented. Well appearing and in no acute distress. Eyes: Conjunctivae are normal. . Head: Atraumatic. Nose: No congestion/rhinnorhea. Mouth/Throat: Mucous membranes are moist.   Neck: No stridor.  Cardiovascular: Normal rate, regular rhythm. Grossly normal heart sounds.  Good peripheral circulation. Respiratory: Normal respiratory effort.  No retractions. Lungs CTAB. Gastrointestinal: Soft and nontender. No distention. No abdominal bruits. No CVA tenderness. Musculoskeletal: No lower extremity tenderness nor edema.  No joint effusions.Upper extremities described in history of present illness Neurologic:  Normal speech and language. No gross focal neurologic deficits are appreciated. No gait instability.   ____________________________________________   LABS (all labs ordered are listed, but only abnormal results are displayed)  Labs Reviewed  BASIC METABOLIC PANEL - Abnormal; Notable for the following:       Result Value   BUN 35 (*)    Creatinine, Ser 1.53 (*)    Calcium 8.6 (*)    GFR calc non Af Amer 40 (*)    GFR calc Af Amer 47 (*)    All other components within normal limits  CBC WITH DIFFERENTIAL/PLATELET - Abnormal; Notable for the following:    RBC 4.16 (*)    Hemoglobin 11.3 (*)    HCT 34.8 (*)    RDW 15.3  (*)    Neutro Abs 8.3 (*)    All other components within normal limits   ____________________________________________  EKG  EKG read and interpreted by me shows sinus bradycardia rate of 45 left axis no acute ST-T wave changes a very poor R-wave progression as well. Patient reported by family to always be somewhat slow as far as his heart rate goes. Review of old EKG shows this is true ____________________________________________  RADIOLOGY Radiology reports the x-rays the elbow wrist and hand show no acute fractures  __IMPRESSION: No evidence of DVT within the right upper extremity.   Electronically Signed   By: Charline BillsSriyesh  Krishnan M.D.   On: 07/30/2016 13:07__________________________________________   PROCEDURES  Procedure(s) performed:  Procedures  Critical Care performed:   ____________________________________________  INITIAL IMPRESSION / ASSESSMENT AND PLAN / ED COURSE  Pertinent labs & imaging results that were available during my care of the patient were reviewed by me and considered in my medical decision making (see chart for details).  Not really sure why the arm is swollen and it is not particularly red as if there is a cellulitis going on he does not have a fever really doesn't have a white count is elevated x-rays are normal is no sign of DVT is not painful as if there was gout there I will start him on some Keflex just in case and have her follow-up with his doctor in the next day or 2.      ____________________________________________   FINAL CLINICAL IMPRESSION(S) / ED DIAGNOSES  Final diagnoses:  Swelling of right upper extremity      NEW MEDICATIONS STARTED DURING THIS VISIT:  New Prescriptions   No medications on file     Note:  This document was prepared using Dragon voice recognition software and may include unintentional dictation errors.    Arnaldo Natal, MD 07/30/16 [phone]

## 2016-09-25 ENCOUNTER — Other Ambulatory Visit
Admission: RE | Admit: 2016-09-25 | Discharge: 2016-09-25 | Disposition: A | Discharge status: Home / Self Care | Payer: Medicare HMO | Source: Ambulatory Visit | Attending: Nurse Practitioner | Admitting: Nurse Practitioner

## 2016-09-25 DIAGNOSIS — N183 Chronic kidney disease, stage 3 (moderate): Secondary | ICD-10-CM | POA: Insufficient documentation

## 2016-09-25 DIAGNOSIS — G309 Alzheimer's disease, unspecified: Secondary | ICD-10-CM | POA: Diagnosis not present

## 2016-09-25 DIAGNOSIS — F0281 Dementia in other diseases classified elsewhere with behavioral disturbance: Secondary | ICD-10-CM | POA: Insufficient documentation

## 2016-09-25 DIAGNOSIS — F0151 Vascular dementia with behavioral disturbance: Secondary | ICD-10-CM | POA: Insufficient documentation

## 2016-09-25 LAB — COMPREHENSIVE METABOLIC PANEL
ALBUMIN: 3.6 g/dL (ref 3.5–5.0)
ALT: 15 U/L — ABNORMAL LOW (ref 17–63)
ANION GAP: 6 (ref 5–15)
AST: 22 U/L (ref 15–41)
Alkaline Phosphatase: 174 U/L — ABNORMAL HIGH (ref 38–126)
BUN: 32 mg/dL — ABNORMAL HIGH (ref 6–20)
CO2: 26 mmol/L (ref 22–32)
Calcium: 8.9 mg/dL (ref 8.9–10.3)
Chloride: 111 mmol/L (ref 101–111)
Creatinine, Ser: 1.35 mg/dL — ABNORMAL HIGH (ref 0.61–1.24)
GFR calc non Af Amer: 47 mL/min — ABNORMAL LOW (ref >60)
GFR, EST AFRICAN AMERICAN: 54 mL/min — AB (ref >60)
GLUCOSE: 107 mg/dL — AB (ref 65–99)
POTASSIUM: 4.3 mmol/L (ref 3.5–5.1)
SODIUM: 143 mmol/L (ref 135–145)
Total Bilirubin: 0.6 mg/dL (ref 0.3–1.2)
Total Protein: 6.9 g/dL (ref 6.5–8.1)

## 2016-09-25 LAB — AMMONIA: Ammonia: 26 umol/L (ref 9–35)

## 2016-10-09 ENCOUNTER — Other Ambulatory Visit: Payer: Self-pay | Admitting: Cardiology

## 2016-10-09 ENCOUNTER — Ambulatory Visit
Admission: RE | Admit: 2016-10-09 | Discharge: 2016-10-09 | Disposition: A | Discharge status: Home / Self Care | Payer: Medicare HMO | Source: Ambulatory Visit | Attending: Internal Medicine | Admitting: Internal Medicine

## 2016-10-09 ENCOUNTER — Other Ambulatory Visit: Payer: Self-pay | Admitting: Internal Medicine

## 2016-10-09 DIAGNOSIS — J9811 Atelectasis: Secondary | ICD-10-CM | POA: Diagnosis not present

## 2016-10-09 DIAGNOSIS — E784 Other hyperlipidemia: Secondary | ICD-10-CM | POA: Diagnosis not present

## 2016-10-09 DIAGNOSIS — R221 Localized swelling, mass and lump, neck: Secondary | ICD-10-CM | POA: Diagnosis present

## 2016-10-09 DIAGNOSIS — F0280 Dementia in other diseases classified elsewhere without behavioral disturbance: Secondary | ICD-10-CM | POA: Diagnosis not present

## 2016-10-09 DIAGNOSIS — R410 Disorientation, unspecified: Secondary | ICD-10-CM

## 2016-10-09 DIAGNOSIS — G309 Alzheimer's disease, unspecified: Secondary | ICD-10-CM | POA: Diagnosis not present

## 2016-10-09 DIAGNOSIS — J90 Pleural effusion, not elsewhere classified: Secondary | ICD-10-CM | POA: Diagnosis not present

## 2016-10-09 DIAGNOSIS — F0390 Unspecified dementia without behavioral disturbance: Secondary | ICD-10-CM | POA: Diagnosis not present

## 2016-10-09 DIAGNOSIS — I10 Essential (primary) hypertension: Secondary | ICD-10-CM | POA: Diagnosis not present

## 2016-10-09 DIAGNOSIS — J984 Other disorders of lung: Secondary | ICD-10-CM | POA: Insufficient documentation

## 2016-10-09 DIAGNOSIS — R350 Frequency of micturition: Secondary | ICD-10-CM | POA: Diagnosis not present

## 2016-10-09 DIAGNOSIS — E034 Atrophy of thyroid (acquired): Secondary | ICD-10-CM | POA: Diagnosis not present

## 2016-10-09 DIAGNOSIS — R5381 Other malaise: Secondary | ICD-10-CM | POA: Diagnosis not present

## 2016-10-10 DIAGNOSIS — I4891 Unspecified atrial fibrillation: Secondary | ICD-10-CM | POA: Diagnosis not present

## 2016-10-10 DIAGNOSIS — D509 Iron deficiency anemia, unspecified: Secondary | ICD-10-CM | POA: Diagnosis not present

## 2016-10-10 DIAGNOSIS — N182 Chronic kidney disease, stage 2 (mild): Secondary | ICD-10-CM | POA: Diagnosis not present

## 2016-10-10 DIAGNOSIS — E785 Hyperlipidemia, unspecified: Secondary | ICD-10-CM | POA: Diagnosis not present

## 2016-10-15 ENCOUNTER — Ambulatory Visit: Payer: Medicare HMO

## 2016-10-19 ENCOUNTER — Ambulatory Visit: Payer: Medicare HMO

## 2016-11-02 ENCOUNTER — Ambulatory Visit
Admission: RE | Admit: 2016-11-02 | Discharge: 2016-11-02 | Disposition: A | Discharge status: Home / Self Care | Payer: Medicare HMO | Source: Ambulatory Visit | Attending: Cardiology | Admitting: Cardiology

## 2016-11-02 DIAGNOSIS — R410 Disorientation, unspecified: Secondary | ICD-10-CM | POA: Diagnosis not present

## 2016-11-02 DIAGNOSIS — G319 Degenerative disease of nervous system, unspecified: Secondary | ICD-10-CM | POA: Diagnosis not present

## 2016-11-02 DIAGNOSIS — I6782 Cerebral ischemia: Secondary | ICD-10-CM | POA: Insufficient documentation

## 2016-11-02 DIAGNOSIS — G309 Alzheimer's disease, unspecified: Secondary | ICD-10-CM | POA: Diagnosis present

## 2016-11-19 DIAGNOSIS — I10 Essential (primary) hypertension: Secondary | ICD-10-CM | POA: Diagnosis not present

## 2016-11-19 DIAGNOSIS — N183 Chronic kidney disease, stage 3 (moderate): Secondary | ICD-10-CM | POA: Diagnosis not present

## 2016-11-20 DIAGNOSIS — N183 Chronic kidney disease, stage 3 (moderate): Secondary | ICD-10-CM | POA: Diagnosis not present

## 2016-11-22 DIAGNOSIS — I2510 Atherosclerotic heart disease of native coronary artery without angina pectoris: Secondary | ICD-10-CM | POA: Diagnosis not present

## 2016-11-22 DIAGNOSIS — I119 Hypertensive heart disease without heart failure: Secondary | ICD-10-CM | POA: Diagnosis not present

## 2016-11-22 DIAGNOSIS — I25810 Atherosclerosis of coronary artery bypass graft(s) without angina pectoris: Secondary | ICD-10-CM | POA: Diagnosis not present

## 2016-11-22 DIAGNOSIS — E785 Hyperlipidemia, unspecified: Secondary | ICD-10-CM | POA: Diagnosis not present

## 2016-11-26 DIAGNOSIS — F0151 Vascular dementia with behavioral disturbance: Secondary | ICD-10-CM | POA: Diagnosis not present

## 2016-11-26 DIAGNOSIS — Z8673 Personal history of transient ischemic attack (TIA), and cerebral infarction without residual deficits: Secondary | ICD-10-CM | POA: Diagnosis not present

## 2016-11-26 DIAGNOSIS — G309 Alzheimer's disease, unspecified: Secondary | ICD-10-CM | POA: Diagnosis not present

## 2016-11-26 DIAGNOSIS — F0281 Dementia in other diseases classified elsewhere with behavioral disturbance: Secondary | ICD-10-CM | POA: Diagnosis not present

## 2016-11-26 DIAGNOSIS — N183 Chronic kidney disease, stage 3 (moderate): Secondary | ICD-10-CM | POA: Diagnosis not present

## 2016-11-26 DIAGNOSIS — I158 Other secondary hypertension: Secondary | ICD-10-CM | POA: Diagnosis not present

## 2016-12-03 DIAGNOSIS — N182 Chronic kidney disease, stage 2 (mild): Secondary | ICD-10-CM | POA: Diagnosis not present

## 2016-12-03 DIAGNOSIS — I4891 Unspecified atrial fibrillation: Secondary | ICD-10-CM | POA: Diagnosis not present

## 2016-12-03 DIAGNOSIS — R413 Other amnesia: Secondary | ICD-10-CM | POA: Diagnosis not present

## 2016-12-03 DIAGNOSIS — D509 Iron deficiency anemia, unspecified: Secondary | ICD-10-CM | POA: Diagnosis not present

## 2016-12-04 ENCOUNTER — Ambulatory Visit: Payer: Medicare HMO | Admitting: Gastroenterology

## 2016-12-07 DIAGNOSIS — L03114 Cellulitis of left upper limb: Secondary | ICD-10-CM | POA: Diagnosis not present

## 2016-12-07 DIAGNOSIS — M7989 Other specified soft tissue disorders: Secondary | ICD-10-CM | POA: Diagnosis not present

## 2016-12-07 DIAGNOSIS — M25542 Pain in joints of left hand: Secondary | ICD-10-CM | POA: Diagnosis not present

## 2016-12-31 DIAGNOSIS — I4892 Unspecified atrial flutter: Secondary | ICD-10-CM | POA: Diagnosis not present

## 2016-12-31 DIAGNOSIS — I4891 Unspecified atrial fibrillation: Secondary | ICD-10-CM | POA: Diagnosis not present

## 2016-12-31 DIAGNOSIS — N182 Chronic kidney disease, stage 2 (mild): Secondary | ICD-10-CM | POA: Diagnosis not present

## 2016-12-31 DIAGNOSIS — R413 Other amnesia: Secondary | ICD-10-CM | POA: Diagnosis not present

## 2017-01-01 DIAGNOSIS — E7849 Other hyperlipidemia: Secondary | ICD-10-CM | POA: Diagnosis not present

## 2017-01-01 DIAGNOSIS — Z125 Encounter for screening for malignant neoplasm of prostate: Secondary | ICD-10-CM | POA: Diagnosis not present

## 2017-01-01 DIAGNOSIS — R5381 Other malaise: Secondary | ICD-10-CM | POA: Diagnosis not present

## 2017-01-01 DIAGNOSIS — E034 Atrophy of thyroid (acquired): Secondary | ICD-10-CM | POA: Diagnosis not present

## 2017-01-01 DIAGNOSIS — I10 Essential (primary) hypertension: Secondary | ICD-10-CM | POA: Diagnosis not present

## 2017-01-01 DIAGNOSIS — E119 Type 2 diabetes mellitus without complications: Secondary | ICD-10-CM | POA: Diagnosis not present

## 2017-01-04 ENCOUNTER — Encounter: Payer: Self-pay | Admitting: Emergency Medicine

## 2017-01-04 ENCOUNTER — Emergency Department: Payer: Medicare HMO

## 2017-01-04 ENCOUNTER — Inpatient Hospital Stay: Payer: Medicare HMO

## 2017-01-04 ENCOUNTER — Inpatient Hospital Stay
Admission: EM | Admit: 2017-01-04 | Discharge: 2017-02-02 | DRG: 186 | Disposition: E | Payer: Medicare HMO | Attending: Internal Medicine | Admitting: Internal Medicine

## 2017-01-04 DIAGNOSIS — J9602 Acute respiratory failure with hypercapnia: Secondary | ICD-10-CM | POA: Diagnosis present

## 2017-01-04 DIAGNOSIS — Z7901 Long term (current) use of anticoagulants: Secondary | ICD-10-CM

## 2017-01-04 DIAGNOSIS — Z66 Do not resuscitate: Secondary | ICD-10-CM | POA: Diagnosis present

## 2017-01-04 DIAGNOSIS — N183 Chronic kidney disease, stage 3 (moderate): Secondary | ICD-10-CM | POA: Diagnosis not present

## 2017-01-04 DIAGNOSIS — R001 Bradycardia, unspecified: Secondary | ICD-10-CM | POA: Diagnosis not present

## 2017-01-04 DIAGNOSIS — I5030 Unspecified diastolic (congestive) heart failure: Secondary | ICD-10-CM | POA: Diagnosis present

## 2017-01-04 DIAGNOSIS — J9600 Acute respiratory failure, unspecified whether with hypoxia or hypercapnia: Secondary | ICD-10-CM | POA: Diagnosis not present

## 2017-01-04 DIAGNOSIS — F0150 Vascular dementia without behavioral disturbance: Secondary | ICD-10-CM | POA: Diagnosis present

## 2017-01-04 DIAGNOSIS — J432 Centrilobular emphysema: Secondary | ICD-10-CM | POA: Diagnosis not present

## 2017-01-04 DIAGNOSIS — I2510 Atherosclerotic heart disease of native coronary artery without angina pectoris: Secondary | ICD-10-CM | POA: Diagnosis present

## 2017-01-04 DIAGNOSIS — I483 Typical atrial flutter: Secondary | ICD-10-CM | POA: Diagnosis not present

## 2017-01-04 DIAGNOSIS — R601 Generalized edema: Secondary | ICD-10-CM

## 2017-01-04 DIAGNOSIS — D631 Anemia in chronic kidney disease: Secondary | ICD-10-CM | POA: Diagnosis present

## 2017-01-04 DIAGNOSIS — R0902 Hypoxemia: Secondary | ICD-10-CM

## 2017-01-04 DIAGNOSIS — R296 Repeated falls: Secondary | ICD-10-CM | POA: Diagnosis present

## 2017-01-04 DIAGNOSIS — W19XXXA Unspecified fall, initial encounter: Secondary | ICD-10-CM | POA: Diagnosis not present

## 2017-01-04 DIAGNOSIS — J441 Chronic obstructive pulmonary disease with (acute) exacerbation: Secondary | ICD-10-CM | POA: Diagnosis not present

## 2017-01-04 DIAGNOSIS — Z8673 Personal history of transient ischemic attack (TIA), and cerebral infarction without residual deficits: Secondary | ICD-10-CM

## 2017-01-04 DIAGNOSIS — Z79899 Other long term (current) drug therapy: Secondary | ICD-10-CM

## 2017-01-04 DIAGNOSIS — S0990XA Unspecified injury of head, initial encounter: Secondary | ICD-10-CM | POA: Diagnosis not present

## 2017-01-04 DIAGNOSIS — N179 Acute kidney failure, unspecified: Secondary | ICD-10-CM | POA: Diagnosis not present

## 2017-01-04 DIAGNOSIS — E875 Hyperkalemia: Secondary | ICD-10-CM | POA: Diagnosis not present

## 2017-01-04 DIAGNOSIS — R0600 Dyspnea, unspecified: Secondary | ICD-10-CM | POA: Diagnosis not present

## 2017-01-04 DIAGNOSIS — I130 Hypertensive heart and chronic kidney disease with heart failure and stage 1 through stage 4 chronic kidney disease, or unspecified chronic kidney disease: Secondary | ICD-10-CM | POA: Diagnosis present

## 2017-01-04 DIAGNOSIS — I10 Essential (primary) hypertension: Secondary | ICD-10-CM | POA: Diagnosis not present

## 2017-01-04 DIAGNOSIS — J9601 Acute respiratory failure with hypoxia: Secondary | ICD-10-CM | POA: Diagnosis present

## 2017-01-04 DIAGNOSIS — J90 Pleural effusion, not elsewhere classified: Secondary | ICD-10-CM | POA: Diagnosis not present

## 2017-01-04 DIAGNOSIS — G300 Alzheimer's disease with early onset: Secondary | ICD-10-CM | POA: Diagnosis present

## 2017-01-04 DIAGNOSIS — F419 Anxiety disorder, unspecified: Secondary | ICD-10-CM | POA: Diagnosis present

## 2017-01-04 DIAGNOSIS — J449 Chronic obstructive pulmonary disease, unspecified: Secondary | ICD-10-CM | POA: Diagnosis not present

## 2017-01-04 DIAGNOSIS — Z7189 Other specified counseling: Secondary | ICD-10-CM | POA: Diagnosis not present

## 2017-01-04 DIAGNOSIS — N189 Chronic kidney disease, unspecified: Secondary | ICD-10-CM

## 2017-01-04 DIAGNOSIS — E872 Acidosis: Secondary | ICD-10-CM | POA: Diagnosis not present

## 2017-01-04 DIAGNOSIS — I5031 Acute diastolic (congestive) heart failure: Secondary | ICD-10-CM | POA: Diagnosis not present

## 2017-01-04 DIAGNOSIS — F0280 Dementia in other diseases classified elsewhere without behavioral disturbance: Secondary | ICD-10-CM | POA: Diagnosis present

## 2017-01-04 DIAGNOSIS — Z9889 Other specified postprocedural states: Secondary | ICD-10-CM | POA: Diagnosis not present

## 2017-01-04 DIAGNOSIS — N138 Other obstructive and reflux uropathy: Secondary | ICD-10-CM | POA: Diagnosis present

## 2017-01-04 DIAGNOSIS — R0602 Shortness of breath: Secondary | ICD-10-CM | POA: Diagnosis not present

## 2017-01-04 DIAGNOSIS — R188 Other ascites: Secondary | ICD-10-CM | POA: Diagnosis not present

## 2017-01-04 DIAGNOSIS — F0281 Dementia in other diseases classified elsewhere with behavioral disturbance: Secondary | ICD-10-CM | POA: Diagnosis not present

## 2017-01-04 DIAGNOSIS — Z85828 Personal history of other malignant neoplasm of skin: Secondary | ICD-10-CM

## 2017-01-04 DIAGNOSIS — Z515 Encounter for palliative care: Secondary | ICD-10-CM | POA: Diagnosis present

## 2017-01-04 DIAGNOSIS — I272 Pulmonary hypertension, unspecified: Secondary | ICD-10-CM | POA: Diagnosis present

## 2017-01-04 DIAGNOSIS — I509 Heart failure, unspecified: Secondary | ICD-10-CM | POA: Diagnosis not present

## 2017-01-04 DIAGNOSIS — G309 Alzheimer's disease, unspecified: Secondary | ICD-10-CM | POA: Diagnosis not present

## 2017-01-04 DIAGNOSIS — F0390 Unspecified dementia without behavioral disturbance: Secondary | ICD-10-CM | POA: Diagnosis not present

## 2017-01-04 DIAGNOSIS — G9340 Encephalopathy, unspecified: Secondary | ICD-10-CM | POA: Diagnosis present

## 2017-01-04 DIAGNOSIS — Z7982 Long term (current) use of aspirin: Secondary | ICD-10-CM

## 2017-01-04 DIAGNOSIS — Z951 Presence of aortocoronary bypass graft: Secondary | ICD-10-CM

## 2017-01-04 DIAGNOSIS — N139 Obstructive and reflux uropathy, unspecified: Secondary | ICD-10-CM | POA: Diagnosis not present

## 2017-01-04 DIAGNOSIS — N401 Enlarged prostate with lower urinary tract symptoms: Secondary | ICD-10-CM | POA: Diagnosis present

## 2017-01-04 DIAGNOSIS — Z87891 Personal history of nicotine dependence: Secondary | ICD-10-CM

## 2017-01-04 DIAGNOSIS — I129 Hypertensive chronic kidney disease with stage 1 through stage 4 chronic kidney disease, or unspecified chronic kidney disease: Secondary | ICD-10-CM | POA: Diagnosis not present

## 2017-01-04 LAB — GLUCOSE, PLEURAL OR PERITONEAL FLUID: Glucose, Fluid: 114 mg/dL

## 2017-01-04 LAB — BODY FLUID CELL COUNT WITH DIFFERENTIAL
EOS FL: 1 %
Lymphs, Fluid: 86 %
Monocyte-Macrophage-Serous Fluid: 4 %
Neutrophil Count, Fluid: 9 %
Total Nucleated Cell Count, Fluid: 341 cu mm

## 2017-01-04 LAB — AMYLASE, PLEURAL OR PERITONEAL FLUID: Amylase, Fluid: 66 U/L

## 2017-01-04 LAB — CBC WITH DIFFERENTIAL/PLATELET
BASOS ABS: 0.1 10*3/uL (ref 0–0.1)
Basophils Relative: 1 %
EOS ABS: 0.1 10*3/uL (ref 0–0.7)
EOS PCT: 1 %
HCT: 36.7 % — ABNORMAL LOW (ref 40.0–52.0)
Hemoglobin: 12 g/dL — ABNORMAL LOW (ref 13.0–18.0)
LYMPHS ABS: 0.9 10*3/uL — AB (ref 1.0–3.6)
LYMPHS PCT: 13 %
MCH: 28.6 pg (ref 26.0–34.0)
MCHC: 32.6 g/dL (ref 32.0–36.0)
MCV: 87.6 fL (ref 80.0–100.0)
Monocytes Absolute: 0.5 10*3/uL (ref 0.2–1.0)
Monocytes Relative: 7 %
NEUTROS PCT: 78 %
Neutro Abs: 5.5 10*3/uL (ref 1.4–6.5)
PLATELETS: 277 10*3/uL (ref 150–440)
RBC: 4.19 MIL/uL — ABNORMAL LOW (ref 4.40–5.90)
RDW: 18.1 % — AB (ref 11.5–14.5)
WBC: 7 10*3/uL (ref 3.8–10.6)

## 2017-01-04 LAB — COMPREHENSIVE METABOLIC PANEL
ALT: 25 U/L (ref 17–63)
AST: 43 U/L — ABNORMAL HIGH (ref 15–41)
Albumin: 3.3 g/dL — ABNORMAL LOW (ref 3.5–5.0)
Alkaline Phosphatase: 191 U/L — ABNORMAL HIGH (ref 38–126)
Anion gap: 6 (ref 5–15)
BUN: 35 mg/dL — ABNORMAL HIGH (ref 6–20)
CHLORIDE: 111 mmol/L (ref 101–111)
CO2: 29 mmol/L (ref 22–32)
Calcium: 8.4 mg/dL — ABNORMAL LOW (ref 8.9–10.3)
Creatinine, Ser: 2.21 mg/dL — ABNORMAL HIGH (ref 0.61–1.24)
GFR, EST AFRICAN AMERICAN: 30 mL/min — AB (ref >60)
GFR, EST NON AFRICAN AMERICAN: 26 mL/min — AB (ref >60)
Glucose, Bld: 113 mg/dL — ABNORMAL HIGH (ref 65–99)
POTASSIUM: 4.8 mmol/L (ref 3.5–5.1)
Sodium: 146 mmol/L — ABNORMAL HIGH (ref 135–145)
Total Bilirubin: 1 mg/dL (ref 0.3–1.2)
Total Protein: 6.4 g/dL — ABNORMAL LOW (ref 6.5–8.1)

## 2017-01-04 LAB — ALBUMIN, PLEURAL OR PERITONEAL FLUID

## 2017-01-04 LAB — TROPONIN I: TROPONIN I: 0.05 ng/mL — AB (ref <0.03)

## 2017-01-04 LAB — BRAIN NATRIURETIC PEPTIDE: B NATRIURETIC PEPTIDE 5: 898 pg/mL — AB (ref 0.0–100.0)

## 2017-01-04 MED ORDER — LAMOTRIGINE 25 MG PO TABS
25.0000 mg | ORAL_TABLET | Freq: Every day | ORAL | Status: DC
  Administered 2017-01-05 – 2017-01-08 (×4): 25 mg via ORAL
  Filled 2017-01-04 (×4): qty 1

## 2017-01-04 MED ORDER — ACETAMINOPHEN 650 MG RE SUPP: 650.0000 mg | Freq: Four times a day (QID) | RECTAL | Status: DC | PRN

## 2017-01-04 MED ORDER — DONEPEZIL HCL 5 MG PO TABS
5.0000 mg | ORAL_TABLET | Freq: Every day | ORAL | Status: DC
  Administered 2017-01-04 – 2017-01-07 (×4): 5 mg via ORAL
  Filled 2017-01-04 (×4): qty 1

## 2017-01-04 MED ORDER — SODIUM CHLORIDE 0.9 % IV SOLN
INTRAVENOUS | Status: DC
  Administered 2017-01-04 – 2017-01-07 (×5): via INTRAVENOUS

## 2017-01-04 MED ORDER — ACETAMINOPHEN 325 MG PO TABS: 650.0000 mg | ORAL_TABLET | Freq: Four times a day (QID) | ORAL | Status: DC | PRN

## 2017-01-04 MED ORDER — DOCUSATE SODIUM 100 MG PO CAPS
100.0000 mg | ORAL_CAPSULE | Freq: Two times a day (BID) | ORAL | Status: DC
  Administered 2017-01-04 – 2017-01-08 (×9): 100 mg via ORAL
  Filled 2017-01-04 (×9): qty 1

## 2017-01-04 MED ORDER — ONDANSETRON HCL 4 MG PO TABS: 4.0000 mg | ORAL_TABLET | Freq: Four times a day (QID) | ORAL | Status: DC | PRN

## 2017-01-04 MED ORDER — ONDANSETRON HCL 4 MG/2ML IJ SOLN: 4.0000 mg | Freq: Four times a day (QID) | INTRAMUSCULAR | Status: DC | PRN

## 2017-01-04 MED ORDER — BISACODYL 5 MG PO TBEC: 5.0000 mg | DELAYED_RELEASE_TABLET | Freq: Every day | ORAL | Status: DC | PRN

## 2017-01-04 MED ORDER — AMLODIPINE BESYLATE 10 MG PO TABS
10.0000 mg | ORAL_TABLET | Freq: Every day | ORAL | Status: DC
  Administered 2017-01-05 – 2017-01-09 (×5): 10 mg via ORAL
  Filled 2017-01-04 (×5): qty 1

## 2017-01-04 NOTE — ED Notes (Signed)
Family reports patient has had 3 falls this week.

## 2017-01-04 NOTE — ED Triage Notes (Signed)
Pt to ED via POV for fall. On triage pt noted to have audible wheezing and swelling in the abdomen, face, and left arm. Pt O2 sat 86% on room air, patient was placed on 2 liter O2 via [place] with improvement in O2 sats to 95%. Pt also noted to be bradycardic. Pt family states that pt has hx/o low heart rate and sees cardiology. Pt does not appear to be in distress at this time.

## 2017-01-04 NOTE — Procedures (Signed)
Interventional Radiology Procedure Note  Procedure: US guided left thoracentesis  Complications: None  Estimated Blood Loss: < 10 mL  1.1 L clear, yellow fluid removed from left pleural space.  Jodi MarbleGlenn T. Fredia SorrowYamagata, M.D Pager:  [phone]

## 2017-01-04 NOTE — ED Notes (Addendum)
Pt transported to US. Informed US that patient has a ready bed and will be going to the room not back to the ED.

## 2017-01-04 NOTE — ED Provider Notes (Signed)
[facility] Emergency Department Provider Note   ____________________________________________   First MD Initiated Contact with Patient 2017/02/02 1247     (approximate)  I have reviewed the triage vital signs and the nursing notes.   HISTORY  Chief Complaint Fall    HPI Ryan Villanueva is a 83 y.o. male with a history of CAD and stroke who is presenting to the emergency department today with falls x3 this week.  The family is especially concerned today because they have noticed increased swelling to his bilateral lower extremities, his abdomen as well as bilateral upper extremities.  Patient also found to be hypoxic to 86% on room air in triage and does not wear oxygen at home.  Patient also noted to be bradycardic but has history of bradycardia per family.  Past Medical History:  Diagnosis Date  . Abnormal prostate specific antigen 03/30/2008   Normal prostate biopsy Dr. Jamelle Rushing 02/16/2013. Chronic and acutely inflamed prostate tissue.    . Atrial flutter (HCC)   . CAD in native artery 03/01/2008   h/o 3V CABG 1990 at [facility]  . Cerebral infarct (HCC) 10/07/2013   dysarthria, arm weakness  . Early onset Alzheimer's dementia   . Hypertension     Patient Active Problem List   Diagnosis Date Noted  . Knee pain 05/30/2015  . AV heart block 05/22/2015  . Atypical atrial flutter (HCC)   . Iron deficiency anemia 03/30/2015  . Acute renal insufficiency 03/30/2015  . H/O coronary artery bypass surgery 11/25/2014  . Late effects of CVA (cerebrovascular accident) 11/25/2014  . Atrial flutter (HCC) 11/04/2014  . Bradycardia 11/04/2014  . 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 HB (heart block) 11/04/2014  . Speech disorder 11/04/2014  . CAD in native artery 03/01/2008  . Arthritis due to gout 03/05/2005  . Essential (primary) hypertension 03/05/1988  . Hypercholesteremia 03/05/1988    Past Surgical History:  Procedure Laterality Date  .  CORONARY ARTERY BYPASS GRAFT    . History of coronary artery bypass graft  1990   x3 Surgeon [facility]    Prior to Admission medications   Medication Sig Start Date End Date Taking? Authorizing Provider  acetaminophen (TYLENOL) 325 MG tablet Take 650 mg by mouth every 6 (six) hours as needed.    [provider]  amLODipine (NORVASC) 10 MG tablet TAKE 1 TABLET EVERY DAY Patient taking differently: TAKE 0.5 TABLET EVERY DAY 11/27/14   Malva Limes, MD  aspirin EC 81 MG tablet Take 81 mg by mouth daily.    [provider]  atorvastatin (LIPITOR) 40 MG tablet Take 1 tablet (40 mg total) by mouth daily. 12/15/14   Malva Limes, MD  hydrochlorothiazide (HYDRODIURIL) 25 MG tablet Take 25 mg by mouth daily.    [provider]  lisinopril (PRINIVIL,ZESTRIL) 20 MG tablet Take 20 mg by mouth daily.    [provider]  MULTIPLE VITAMIN PO Take 1 tablet by mouth daily. 03/01/08   [provider]  Rivaroxaban (XARELTO) 15 MG TABS tablet Take 1 tablet (15 mg total) by mouth daily with supper. Patient not taking: Reported on 07/30/2016 08/17/15   Iran Ouch, MD    Allergies Patient has no known allergies.  No family history on file.  Social History Social History  Substance Use Topics  . Smoking status: Former Smoker    Years: 30.00    Types: Cigarettes  . Smokeless tobacco: Never Used     Comment: quit  smoking in the 1990's uses smokeless tobacco  . Alcohol use No    Review of Systems  Constitutional: No fever/chills Eyes: No visual changes. ENT: No sore throat. Cardiovascular: Denies chest pain. Respiratory: Denies shortness of breath. Gastrointestinal: No abdominal pain.  No nausea, no vomiting.  No diarrhea.  No constipation. Genitourinary: Negative for dysuria. Musculoskeletal: Negative for back pain. Skin: Negative for rash. Neurological: Negative for headaches, focal weakness or  numbness.   ____________________________________________   PHYSICAL EXAM:  VITAL SIGNS: ED Triage Vitals  Enc Vitals Group     BP 01-12-2017 1217 (!) 170/58     Pulse Rate January 12, 2017 1217 (!) 43     Resp 01/12/2017 1217 20     Temp 01-12-17 1217 97.8 F (36.6 C)     Temp Source Jan 12, 2017 1217 Oral     SpO2 2017/01/12 1217 (!) 86 %     Weight --      Height --      Head Circumference --      Peak Flow --      Pain Score 01/12/17 1259 0     Pain Loc --      Pain Edu? --      Excl. in GC? --     Constitutional: Alert and oriented. Well appearing and in no acute distress. Eyes: Conjunctivae are normal.  Head: Atraumatic. Nose: No congestion/rhinnorhea. Mouth/Throat: Mucous membranes are moist.  Neck: No stridor.   Cardiovascular: Normal rate, regular rhythm. Grossly normal heart sounds.   Respiratory: Normal respiratory effort.  No retractions.  Rales to the bilateral bases but on the left side there are decreased breath sounds from the base to the mid field.  Patient now with oxygen saturations in the 90s on 2 L of nasal cannula oxygen. Gastrointestinal: Soft and nontender. No distention. No CVA tenderness. Musculoskeletal: No lower extremity tenderness nor edema.  No joint effusions. Neurologic:  Normal speech and language. No gross focal neurologic deficits are appreciated. Skin:  Skin is warm, dry and intact. No rash noted. Psychiatric: Mood and affect are normal. Speech and behavior are normal.  ____________________________________________   LABS (all labs ordered are listed, but only abnormal results are displayed)  Labs Reviewed  CBC WITH DIFFERENTIAL/PLATELET - Abnormal; Notable for the following:       Result Value   RBC 4.19 (*)    Hemoglobin 12.0 (*)    HCT 36.7 (*)    RDW 18.1 (*)    Lymphs Abs 0.9 (*)    All other components within normal limits  COMPREHENSIVE METABOLIC PANEL  TROPONIN I  BRAIN NATRIURETIC PEPTIDE    ____________________________________________  EKG  ED ECG REPORT I, Arelia Longest, the attending physician, personally viewed and interpreted this ECG.   Date: 01/12/17  EKG Time: 1213  Rate: 43  Rhythm: Second-degree AV block type II with 3-1 AV conduction  Axis: Left  Intervals:right bundle branch block  ST&T Change: No ST segment elevation or depression.  T wave inversions in 3 and aVF. No significant change from previous. ____________________________________________  RADIOLOGY  Patient with large left-sided pleural effusion.  Atelectasis versus infiltrate underlying ____________________________________________   PROCEDURES  Procedure(s) performed: None  Procedures  Critical Care performed: No  ____________________________________________   INITIAL IMPRESSION / ASSESSMENT AND PLAN / ED COURSE  Pertinent labs & imaging results that were available during my care of the patient were reviewed by me and considered in my medical decision making (see chart for details).  Differential includes,  but is not limited to, viral syndrome, bronchitis including COPD exacerbation, pneumonia, reactive airway disease including asthma, CHF including exacerbation with or without pulmonary/interstitial edema, pneumothorax, ACS, thoracic trauma, and pulmonary embolism.  As part of my medical decision making, I reviewed the following data within the electronic MEDICAL RECORD NUMBER Old chart reviewed and Notes from prior ED visits   ----------------------------------------- 1:42 PM on 11/14/2016 -----------------------------------------  Patient with large left-sided pleural effusion which likely explains her hypoxia.  Patient will be admitted to the hospital.  Also with acute renal failure.  Patient aware of need for admission.  Signed out to Dr. Allena KatzPatel.     ____________________________________________   FINAL CLINICAL IMPRESSION(S) / ED DIAGNOSES  Pleural effusion.  Acute  on chronic renal failure.  Anasarca.  Hypoxia.    NEW MEDICATIONS STARTED DURING THIS VISIT:  New Prescriptions   No medications on file     Note:  This document was prepared using Dragon voice recognition software and may include unintentional dictation errors.     Myrna BlazerSchaevitz, David Matthew, MD Mar 02, 2017 1343

## 2017-01-04 NOTE — ED Triage Notes (Signed)
FIRST NURSE NOTE-pt has fallen X 2 recently. Wife describes skin tear and some swelling. Pt not in distress.

## 2017-01-04 NOTE — Progress Notes (Signed)
Family Meeting Note  Advance Directive:yes  Today a meeting took place with the Patient, spouse and Daughter at bedside.  Patient is unable to participate due YS:AYTKZSto:Lacked capacity Dementia   The following clinical team members were present during this meeting:MD  The following were discussed:Patient's diagnosis:  * coronary artery disease status post CABG, * dementia,  * hypertension  * large left pleural effusion,  * ascites  *Acute on chronic kidney disease stage III * Mixed Dementia, Vascular Dementia and Alzheimer's Disease:  * History of stroke  Patient's progosis: > 12 months and Goals for treatment: DNR  Additional follow-up to be provided: Outpt Palliative care eval  Time spent during discussion:20 minutes  Delfino LovettVipul Mattheo Swindle, MD

## 2017-01-04 NOTE — H&P (Signed)
[facility] [place] [facility]   PATIENT NAME: Ryan Villanueva    MR#:  [id number]  DATE OF BIRTH:  04/03/1930  DATE OF ADMISSION:  01/18/2017  PRIMARY CARE PHYSICIAN: Corky DownsMasoud, Javed, [facility]   REQUESTING/REFERRING PHYSICIAN: Myrna BlazerSchaevitz, David Matthew, MD  CHIEF COMPLAINT:   Chief Complaint  Patient presents with  . Fall    HISTORY OF PRESENT ILLNESS:  Ryan Villanueva  is a 83 y.o. male with a known history of  CAD and stroke who is presenting to the emergency department today with falls x3 this week.  The family is especially concerned today because they have noticed increased swelling on his abdomen as well as bilateral upper extremities.  Patient also found to be hypoxic to 86% on room air in triage and does not wear oxygen at home.  Patient also noted to be bradycardic but has history of bradycardia per family.  Chest x-ray shows large left pleural effusion.  Is a difficult historian due to his dementia.  Most of the history was reviewed with family (his wife and daughter at bedside) and per ED records. PAST MEDICAL HISTORY:   Past Medical History:  Diagnosis Date  . Abnormal prostate specific antigen 03/30/2008   Normal prostate biopsy Dr. Jamelle RushingStioff 02/16/2013. Chronic and acutely inflamed prostate tissue.    . Atrial flutter (HCC)   . CAD in native artery 03/01/2008   [facility]  . Cerebral infarct (HCC) 10/07/2013   dysarthria, arm weakness  . Early onset Alzheimer's dementia   . Hypertension     PAST SURGICAL HISTORY:   Past Surgical History:  Procedure Laterality Date  . CORONARY ARTERY BYPASS GRAFT    . History of coronary artery bypass graft  1990   [facility]  Skin cancer removed  from the back  SOCIAL HISTORY:   Social History  Substance Use Topics  . Smoking status: Former Smoker    Years: 30.00    Types: Cigarettes  . Smokeless tobacco: Never Used     Comment: quit smoking in the 1990's uses smokeless tobacco  . Alcohol use No     FAMILY HISTORY:  No family history on file. . Cancer Mother  . Cancer Father  DRUG ALLERGIES:  No Known Allergies  REVIEW OF SYSTEMS:   Review of Systems  Unable to perform ROS: Dementia    MEDICATIONS AT HOME:   Prior to Admission medications   Medication Sig Start Date End Date Taking? Authorizing Provider  amLODipine (NORVASC) 10 MG tablet TAKE 1 TABLET EVERY DAY 11/27/14  Yes Malva LimesFisher, Donald E, MD  atorvastatin (LIPITOR) 40 MG tablet Take 1 tablet (40 mg total) by mouth daily. 12/15/14  Yes Malva LimesFisher, Donald E, MD  clopidogrel (PLAVIX) 75 MG tablet Take 75 mg by mouth daily.   Yes [provider]  lamoTRIgine (LAMICTAL) 25 MG tablet Take 25 mg by mouth daily.   Yes [provider]  MULTIPLE VITAMIN PO Take 1 tablet by mouth daily. 03/01/08  Yes [provider]  acetaminophen (TYLENOL) 325 MG tablet Take 650 mg by mouth every 6 (six) hours as needed.    [provider]  Rivaroxaban (XARELTO) 15 MG TABS tablet Take 1 tablet (15 mg total) by mouth daily with supper. Patient not taking: Reported on 07/30/2016 08/17/15   Iran OuchArida, Muhammad A, MD   VITAL SIGNS:  Blood pressure (!) 167/56, pulse (!) 43, temperature 97.8 F (36.6 C), temperature source Oral, resp. rate 14,  SpO2 95 %.  PHYSICAL EXAMINATION:  Physical Exam  GENERAL:  83 y.o.-year-old patient lying in the bed with no acute distress.  EYES: Pupils equal, round, reactive to light and accommodation. No scleral icterus. Extraocular muscles intact.  HEENT: Head atraumatic, normocephalic. Oropharynx and nasopharynx clear.  NECK:  Supple, no jugular venous distention. No thyroid enlargement, no tenderness.  LUNGS: Normal breath sounds bilaterally, no wheezing, rales,rhonchi or crepitation. No use of accessory muscles of respiration.  CARDIOVASCULAR: S1, S2 normal. No murmurs, rubs, or gallops.  ABDOMEN: Soft, nontender, nondistended. Bowel sounds present. No organomegaly or mass.    EXTREMITIES: No pedal edema, cyanosis, or clubbing.  NEUROLOGIC: Cranial nerves II through XII are intact. Muscle strength 5/5 in all extremities. Sensation intact. Gait not checked.  PSYCHIATRIC: The patient is alert, but disoriented  SKIN: No obvious rash, lesion, or ulcer.   LABORATORY PANEL:   CBC  Recent Labs Lab 01/16/2017 1237  WBC 7.0  HGB 12.0*  HCT 36.7*  PLT 277   ------------------------------------------------------------------------------------------------------------------  Chemistries   Recent Labs Lab 01/08/2017 1237  NA 146*  K 4.8  CL 111  CO2 29  GLUCOSE 113*  BUN 35*  CREATININE 2.21*  CALCIUM 8.4*  AST 43*  ALT 25  ALKPHOS 191*  BILITOT 1.0   ------------------------------------------------------------------------------------------------------------------  Cardiac Enzymes  Recent Labs Lab 01/18/2017 1237  TROPONINI 0.05*   ------------------------------------------------------------------------------------------------------------------  RADIOLOGY:  Dg Chest 2 View  Result Date: 01/28/2017 CLINICAL DATA:  Shortness of breath. EXAM: CHEST  2 VIEW COMPARISON:  Radiographs of October 09, 2016. FINDINGS: Stable cardiomegaly. Status post coronary artery bypass graft. Mild right basilar subsegmental atelectasis is noted. Large left pleural effusion is noted with probable underlying atelectasis or infiltrate. Atherosclerosis of thoracic aorta is noted. Bony thorax is unremarkable. IMPRESSION: Interval development of large left pleural effusion with probable underlying atelectasis or infiltrate. Electronically Signed   By: Lupita Raider, M.D.   On: 01/23/2017 13:28   Ct Head Wo Contrast  Result Date: 01/18/2017 CLINICAL DATA:  Status post fall x3 over the past week. EXAM: CT HEAD WITHOUT CONTRAST TECHNIQUE: Contiguous axial images were obtained from the base of the skull through the vertex without intravenous contrast. COMPARISON:  Head CT scan  11/02/2016. FINDINGS: Brain: Cortical atrophy and chronic microvascular ischemic change are again seen. No evidence of acute abnormality including hemorrhage, infarct, mass lesion, mass effect, midline shift or abnormal extra-axial fluid collection. No hydrocephalus or pneumocephalus. Vascular: Extensive atherosclerosis noted. Skull: Intact. Sinuses/Orbits: Negative. Other: None. IMPRESSION: No acute abnormality. Atrophy and chronic microvascular ischemic change. Extensive atherosclerosis. Electronically Signed   By: Drusilla Kanner M.D.   On: 01/30/2017 13:49   IMPRESSION AND PLAN:  83 year old male with a known history of coronary artery disease status post CABG, dementia, hypertension is being admitted for hypoxia there is a large left pleural effusion, ascites and acute renal failure  *Hypoxia -Likely due to large left pleural effusion  -Considering his chronic kidney disease and start of diuresis we will go ahead and order thoracentesis  *Large left pleural effusion -IR guided thoracentesis ordered and discussed with radiology -fluid studies ordered  *Acute on chronic kidney disease stage III -Monitor with gentle hydration -Avoid nephrotoxic medication -Creatinine at 2.21, base line at 1.35 -follows with Dr. Thedore Mins nephrology  * Mixed Dementia, Vascular Dementia and Alzheimer's Disease:  -Continue Aricept, with Dr. Sherryll Burger neurology  * History of stroke -holding Plavix and Lipitor for now considering need for Thora/paracentesis -Lipitor may not be ideal with  him having recurrent falls  -We will get physical and occupational therapy evaluation. he may need placement      All the records are reviewed and case discussed with ED provider. Management plans discussed with the patient, family (daughter and wife at bedside) and they are in agreement.  CODE STATUS: DNR  TOTAL TIME TAKING CARE OF THIS PATIENT: 45 minutes.    Delfino Lovett M.D on 01/03/2017 at 2:46 PM  Between 7am to 6pm  - Pager - [phone]  After 6pm go to [email or web address] - Social research officer, government  [facility]  Office  [phone]  CC: Primary care physician; Corky Downs, MD   Note: This dictation was prepared with Dragon dictation along with smaller phrase technology. Any transcriptional errors that result from this process are unintentional.

## 2017-01-05 ENCOUNTER — Inpatient Hospital Stay: Payer: Medicare HMO

## 2017-01-05 LAB — GLUCOSE, CAPILLARY: Glucose-Capillary: 80 mg/dL (ref 65–99)

## 2017-01-05 LAB — ACID FAST SMEAR (AFB, MYCOBACTERIA): Acid Fast Smear: NEGATIVE

## 2017-01-05 LAB — CBC
HCT: 36.3 % — ABNORMAL LOW (ref 40.0–52.0)
Hemoglobin: 11.7 g/dL — ABNORMAL LOW (ref 13.0–18.0)
MCH: 28.4 pg (ref 26.0–34.0)
MCHC: 32.3 g/dL (ref 32.0–36.0)
MCV: 87.9 fL (ref 80.0–100.0)
PLATELETS: 236 10*3/uL (ref 150–440)
RBC: 4.13 MIL/uL — AB (ref 4.40–5.90)
RDW: 18.3 % — AB (ref 11.5–14.5)
WBC: 6.2 10*3/uL (ref 3.8–10.6)

## 2017-01-05 LAB — BASIC METABOLIC PANEL
Anion gap: 7 (ref 5–15)
BUN: 35 mg/dL — ABNORMAL HIGH (ref 6–20)
CALCIUM: 8.1 mg/dL — AB (ref 8.9–10.3)
CO2: 28 mmol/L (ref 22–32)
CREATININE: 2.42 mg/dL — AB (ref 0.61–1.24)
Chloride: 111 mmol/L (ref 101–111)
GFR calc non Af Amer: 23 mL/min — ABNORMAL LOW (ref >60)
GFR, EST AFRICAN AMERICAN: 27 mL/min — AB (ref >60)
Glucose, Bld: 94 mg/dL (ref 65–99)
Potassium: 4.6 mmol/L (ref 3.5–5.1)
SODIUM: 146 mmol/L — AB (ref 135–145)

## 2017-01-05 NOTE — Progress Notes (Addendum)
PT Cancellation Note  Patient Details Name: Ryan Villanueva MRN: [id number] DOB: 09/25/1930   Cancelled Treatment:    Reason Eval/Treat Not Completed: Patient not medically ready. Pt received reclined in bed, nasal canula partially doffed, likely secondary to moving in bed. Pt is oriented to self and place only, with strong memory recall deficits and anomia, pleasant, participatory. He is unable to offer much regarding baseline functional status. Pt is desaturated upon arrival, unable to bring to >89% with increased flow rate and/or positioning. Unable to perform evaluation at this time. RN notified of desaturation. Will attempt again at later date/time as medically appropriate.   SpO2 in session:  Supine room air--> 85%, HR: 42BPM Supine 4LPM O2--> 89%, HR: 42BPM Seated EOB 4LPM O2--> 85%, HR: 53BPM Seated EOB 5LPM O2--> 87%, HR: 50BPM    10:14 AM, 01/05/17 Rosamaria LintsAllan C Collie Wernick, PT, DPT Physical Therapist - [place] [phone] (ASCOM)  [phone] (mobile)   Maven Varelas C 01/05/2017, 10:07 AM

## 2017-01-05 NOTE — Progress Notes (Signed)
[employer] [facility] [place] [facility]   PATIENT NAME: Ryan Villanueva    MR#:  [id number]  DATE OF BIRTH:  June 03, 1930  SUBJECTIVE:  CHIEF COMPLAINT:   Chief Complaint  Patient presents with  . Fall    had SOB, Found left sided pleural effusion, s/p thoracentesis.  Have terminal dementia, still on 4-5 ltr oxygen. No distress.  REVIEW OF SYSTEMS:  Pt is demented.  ROS  DRUG ALLERGIES:  No Known Allergies  VITALS:  Blood pressure (!) 148/67, pulse (!) 42, temperature 97.7 F (36.5 C), temperature source Oral, resp. rate 19, height 5\' 7"  (1.702 m), weight 77.1 kg (170 lb), SpO2 92 %.  PHYSICAL EXAMINATION:  GENERAL:  83 y.o.-year-old patient lying in the bed with no acute distress.  EYES: Pupils equal, round, reactive to light and accommodation. No scleral icterus. Extraocular muscles intact.  HEENT: Head atraumatic, normocephalic. Oropharynx and nasopharynx clear.  NECK:  Supple, no jugular venous distention. No thyroid enlargement, no tenderness.  LUNGS: Normal breath sounds bilaterally, no wheezing, rales,rhonchi or crepitation. No use of accessory muscles of respiration.  CARDIOVASCULAR: S1, S2 normal. No murmurs, rubs, or gallops.  ABDOMEN: Soft, nontender, nondistended. Bowel sounds present. No organomegaly or mass.  EXTREMITIES: No pedal edema, cyanosis, or clubbing.  NEUROLOGIC: Cranial nerves II through XII are intact. Muscle strength 5/5 in all extremities. Sensation intact. Gait not checked.  PSYCHIATRIC: The patient is alert, but disoriented  SKIN: No obvious rash, lesion, or ulcer.   Physical Exam LABORATORY PANEL:   CBC  Recent Labs Lab 01/05/17 0428  WBC 6.2  HGB 11.7*  HCT 36.3*  PLT 236   ------------------------------------------------------------------------------------------------------------------  Chemistries   Recent Labs Lab 17-Jan-2017 1237 01/05/17 0428  NA 146* 146*  K 4.8 4.6  CL 111 111  CO2 29 28  GLUCOSE 113* 94  BUN  35* 35*  CREATININE 2.21* 2.42*  CALCIUM 8.4* 8.1*  AST 43*  --   ALT 25  --   ALKPHOS 191*  --   BILITOT 1.0  --    ------------------------------------------------------------------------------------------------------------------  Cardiac Enzymes  Recent Labs Lab January 17, 2017 1237  TROPONINI 0.05*   ------------------------------------------------------------------------------------------------------------------  RADIOLOGY:  Dg Chest 1 View  Result Date: 01/05/2017 CLINICAL DATA:  Hypoxia per request. Thoracentesis yesterday left lung EXAM: CHEST 1 VIEW COMPARISON:  01/17/17 FINDINGS: Lung volumes remain low. There is lung base opacity more evident on the left, likely atelectasis. There probable small pleural effusions. No evidence of pulmonary edema. Cardiac silhouette is mildly enlarged. Stable changes from CABG surgery. No pneumothorax. IMPRESSION: 1. No significant change from the most recent prior study. 2. No pneumothorax. 3. Probable small effusions with left greater than right lung base opacity, likely atelectasis. No convincing pulmonary edema. Electronically Signed   By: Amie Portland M.D.   On: 01/05/2017 14:24   Dg Chest 1 View  Result Date: January 17, 2017 CLINICAL DATA:  Status post left-sided thoracentesis. Dementia. Hypertension. CABG. EXAM: CHEST 1 VIEW COMPARISON:  Earlier today at 1317 hours. FINDINGS: 1548 hours. Midline trachea. Cardiomegaly accentuated by AP portable technique. Prior median sternotomy. Atherosclerosis in the transverse aorta. No pneumothorax. Decrease in left-sided pleural effusion, small. Mild interstitial edema, accentuated by diminished lung volumes. Improved left base airspace disease with similar right base opacity. IMPRESSION: Decreased left-sided pleural effusion, without pneumothorax. Improved left and similar right base airspace disease, favored to represent atelectasis. Cardiomegaly with mild congestive heart failure. Electronically Signed   By:  Jeronimo Greaves M.D.   On: Jan 17, 2017 16:09  Dg Chest 2 View  Result Date: 01/29/17 CLINICAL DATA:  Shortness of breath. EXAM: CHEST  2 VIEW COMPARISON:  Radiographs of October 09, 2016. FINDINGS: Stable cardiomegaly. Status post coronary artery bypass graft. Mild right basilar subsegmental atelectasis is noted. Large left pleural effusion is noted with probable underlying atelectasis or infiltrate. Atherosclerosis of thoracic aorta is noted. Bony thorax is unremarkable. IMPRESSION: Interval development of large left pleural effusion with probable underlying atelectasis or infiltrate. Electronically Signed   By: Lupita Raider, M.D.   On: 01/29/17 13:28   Ct Head Wo Contrast  Result Date: 01/29/2017 CLINICAL DATA:  Status post fall x3 over the past week. EXAM: CT HEAD WITHOUT CONTRAST TECHNIQUE: Contiguous axial images were obtained from the base of the skull through the vertex without intravenous contrast. COMPARISON:  Head CT scan 11/02/2016. FINDINGS: Brain: Cortical atrophy and chronic microvascular ischemic change are again seen. No evidence of acute abnormality including hemorrhage, infarct, mass lesion, mass effect, midline shift or abnormal extra-axial fluid collection. No hydrocephalus or pneumocephalus. Vascular: Extensive atherosclerosis noted. Skull: Intact. Sinuses/Orbits: Negative. Other: None. IMPRESSION: No acute abnormality. Atrophy and chronic microvascular ischemic change. Extensive atherosclerosis. Electronically Signed   By: Drusilla Kanner M.D.   On: 01/29/17 13:49   US Abdomen Limited  Result Date: 2017-01-29 CLINICAL DATA:  Ascites on physical examination. EXAM: LIMITED ABDOMEN ULTRASOUND FOR ASCITES TECHNIQUE: Limited ultrasound survey for ascites was performed in all four abdominal quadrants. COMPARISON:  None. FINDINGS: Survey of the peritoneal cavity demonstrates ascites of relatively small volume and primarily adjacent to the liver and in the left upper quadrant. There  overall is not enough fluid to warrant paracentesis at this time. IMPRESSION: Overall small volume of ascites in the peritoneal cavity. Electronically Signed   By: Irish Lack M.D.   On: 01-29-2017 16:38   US Thoracentesis Asp Pleural Space W/img Guide  Result Date: Jan 29, 2017 CLINICAL DATA:  Left pleural effusion. EXAM: ULTRASOUND GUIDED LEFT THORACENTESIS COMPARISON:  Chest x-ray earlier today. PROCEDURE: An ultrasound guided thoracentesis was thoroughly discussed with the patient's wife and questions answered. The benefits, risks, alternatives and complications were also discussed. The patient's wife understands and wishes to proceed with the procedure. Written consent was obtained. Ultrasound was performed to localize and mark an adequate pocket of fluid in the left chest. The area was then prepped and draped in the normal sterile fashion. 1% Lidocaine was used for local anesthesia. Under ultrasound guidance a 6 French Safe-T-Centesis catheter was introduced. Thoracentesis was performed. The catheter was removed and a dressing applied. COMPLICATIONS: None FINDINGS: A total of approximately 1.1 L of clear, yellow fluid was removed. A fluid sample was sent for laboratory analysis. IMPRESSION: Successful ultrasound guided left thoracentesis yielding 1.1 L of pleural fluid. Electronically Signed   By: Irish Lack M.D.   On: 01/29/17 16:33    ASSESSMENT AND PLAN:   Active Problems:   Large pleural effusion  83 year old male with a known history of coronary artery disease status post CABG, dementia, hypertension is being admitted for hypoxia there is a large left pleural effusion, ascites and acute renal failure  *Hypoxia -Likely due to large left pleural effusion  -Considering his chronic kidney disease and start of diuresis s/p thoracentesis  there seems like CHF is playing a role.   Check Echo.  *Large left pleural effusion -IR guided thoracentesis done, 1.1 ltr fluid  removed. -fluid studies ordered, waiting cx report.  *Acute on chronic kidney disease stage III -Monitor with gentle  hydration -Avoid nephrotoxic medication -Creatinine at 2.21, base line at 1.35 -follows with Dr. Thedore MinsSingh nephrology  * Mixed Dementia, Vascular Dementia and Alzheimer's Disease:  -Continue Aricept, with Dr. Sherryll BurgerShah neurology  * History of stroke -holding Plavix and Lipitor for now considering need for Thora/paracentesis -Lipitor may not be ideal with him having recurrent falls  -We will get physical and occupational therapy evaluation. he may need placement    All the records are reviewed and case discussed with Care Management/Social Workerr. Management plans discussed with the patient, family and they are in agreement.  CODE STATUS: DNR  TOTAL TIME TAKING CARE OF THIS PATIENT: 35 minutes.     POSSIBLE D/C IN 1-2 DAYS, DEPENDING ON CLINICAL CONDITION.   Altamese DillingVACHHANI, Bettye Sitton M.D on 01/05/2017   Between 7am to 6pm - Pager - [phone]  After 6pm go to [email or web address] - password [email or web address]  [facility]  Office  [phone]  CC: Primary care physician; Corky DownsMasoud, Javed, MD  Note: This dictation was prepared with Dragon dictation along with smaller phrase technology. Any transcriptional errors that result from this process are unintentional.

## 2017-01-05 NOTE — Plan of Care (Signed)
Problem: Physical Regulation: Goal: Ability to maintain clinical measurements within normal limits will improve Outcome: Progressing O2 sats 89% on 6L O2 per [place]. Pt seems comfortable. HR in the low 40's, Atrial flutter. Dr Elisabeth PigeonVachhani is aware. No new orders.

## 2017-01-05 NOTE — Progress Notes (Signed)
OT Cancellation Note  Patient Details Name: Ryan Villanueva MRN: [id number] DOB: 01/05/1931   Cancelled Treatment:    Reason Eval/Treat Not Completed: Patient not medically ready. Order received, chart reviewed. Pt not medically appropriate at this time for OT evaluation. HR </= 50bpm, SpO2 87% on 5L O2. Will re-attempt at later date/time as appropriate.  Richrd PrimeJamie Stiller, MPH, MS, OTR/L ascom [phone] 01/05/17, 10:37 AM

## 2017-01-06 ENCOUNTER — Other Ambulatory Visit: Payer: Self-pay

## 2017-01-06 ENCOUNTER — Inpatient Hospital Stay: Payer: Medicare HMO

## 2017-01-06 ENCOUNTER — Inpatient Hospital Stay
Admit: 2017-01-06 | Discharge: 2017-01-06 | Disposition: A | Discharge status: Home / Self Care | Payer: Medicare HMO | Attending: Internal Medicine | Admitting: Internal Medicine

## 2017-01-06 LAB — ECHOCARDIOGRAM COMPLETE
Height: 67 in
WEIGHTICAEL: 2944 [oz_av]

## 2017-01-06 LAB — PROTEIN / CREATININE RATIO, URINE
CREATININE, URINE: 230 mg/dL
PROTEIN CREATININE RATIO: 2.19 mg/mg{creat} — AB (ref 0.00–0.15)
Total Protein, Urine: 503 mg/dL

## 2017-01-06 LAB — BASIC METABOLIC PANEL
Anion gap: 8 (ref 5–15)
BUN: 34 mg/dL — AB (ref 6–20)
CHLORIDE: 110 mmol/L (ref 101–111)
CO2: 24 mmol/L (ref 22–32)
Calcium: 8 mg/dL — ABNORMAL LOW (ref 8.9–10.3)
Creatinine, Ser: 2.41 mg/dL — ABNORMAL HIGH (ref 0.61–1.24)
GFR, EST AFRICAN AMERICAN: 27 mL/min — AB (ref >60)
GFR, EST NON AFRICAN AMERICAN: 23 mL/min — AB (ref >60)
Glucose, Bld: 96 mg/dL (ref 65–99)
POTASSIUM: 5 mmol/L (ref 3.5–5.1)
Sodium: 142 mmol/L (ref 135–145)

## 2017-01-06 MED ORDER — FUROSEMIDE 10 MG/ML IJ SOLN
60.0000 mg | Freq: Once | INTRAMUSCULAR | Status: AC
  Administered 2017-01-06: 05:00:00 60 mg via INTRAVENOUS
  Filled 2017-01-06: qty 8

## 2017-01-06 MED ORDER — ALBUTEROL SULFATE (2.5 MG/3ML) 0.083% IN NEBU: 2.5000 mg | INHALATION_SOLUTION | RESPIRATORY_TRACT | Status: DC | PRN

## 2017-01-06 MED ORDER — IPRATROPIUM-ALBUTEROL 0.5-2.5 (3) MG/3ML IN SOLN
3.0000 mL | Freq: Four times a day (QID) | RESPIRATORY_TRACT | Status: DC
  Administered 2017-01-06 (×2): 3 mL via RESPIRATORY_TRACT
  Filled 2017-01-06 (×2): qty 3

## 2017-01-06 NOTE — Progress Notes (Signed)
[facility]  ROUNDING NOTE   Subjective:  Patient known to [place] as an outpatient. He sees Dr. Thedore Mins for underlying chronic kidney disease. Patient's baseline creatinine is 1.3. He was started on IV fluids but was given Lasix overnight. He now has some urinary retention with 450 cc in the bladder. Nursing instructed to Place Foley catheter.  Objective:  Vital signs in last 24 hours:  Temp:  [97.5 F (36.4 C)-97.7 F (36.5 C)] 97.5 F (36.4 C) (11/04 1118) Pulse Rate:  [41-43] 42 (11/04 1118) Resp:  [19-22] 22 (11/04 1118) BP: (128-148)/(67-98) 130/96 (11/04 1118) SpO2:  [89 %-98 %] 98 % (11/04 1118) Weight:  [83.5 kg (184 lb)] 83.5 kg (184 lb) (11/04 0427)  Weight change: 1.361 kg (3 lb) Filed Weights   January 28, 2017 1735 01/05/17 0401 01/06/17 0427  Weight: 82.1 kg (181 lb) 77.1 kg (170 lb) 83.5 kg (184 lb)    Intake/Output: I/O last 3 completed shifts: In: 1420 [P.O.:840; I.V.:580] Out: 100 [Urine:100]   Intake/Output this shift:  No intake/output data recorded.  Physical Exam: General: No acute distress, laying in bed  Head: Normocephalic, atraumatic. Moist oral mucosal membranes  Eyes: Anicteric  Neck: Supple, trachea midline  Lungs:  Clear to auscultation, normal effort  Heart: S1S2 no rubs  Abdomen:  Soft, nontender, bowel sounds present  Extremities: No peripheral edema.  Neurologic: Awake, but confused  Skin: No lesions       Basic Metabolic Panel: Recent Labs  Lab 01-28-17 1237 01/05/17 0428 01/06/17 0444  NA 146* 146* 142  K 4.8 4.6 5.0  CL 111 111 110  CO2 29 28 24   GLUCOSE 113* 94 96  BUN 35* 35* 34*  CREATININE 2.21* 2.42* 2.41*  CALCIUM 8.4* 8.1* 8.0*    Liver Function Tests: Recent Labs  Lab 2017/01/28 1237  AST 43*  ALT 25  ALKPHOS 191*  BILITOT 1.0  PROT 6.4*  ALBUMIN 3.3*   No results for input(s): LIPASE, AMYLASE in the last 168 hours. No results for input(s): AMMONIA in the last 168 hours.  CBC: Recent Labs   Lab January 28, 2017 1237 01/05/17 0428  WBC 7.0 6.2  NEUTROABS 5.5  --   HGB 12.0* 11.7*  HCT 36.7* 36.3*  MCV 87.6 87.9  PLT 277 236    Cardiac Enzymes: Recent Labs  Lab 2017-01-28 1237  TROPONINI 0.05*    BNP: Invalid input(s): POCBNP  CBG: Recent Labs  Lab 01/05/17 0743  GLUCAP 80    Microbiology: Results for orders placed or performed during the hospital encounter of 2017/01/28  Body fluid culture     Status: None (Preliminary result)   Collection Time: January 28, 2017  3:40 PM  Result Value Ref Range Status   Specimen Description PLEURAL  Final   Special Requests NONE  Final   Gram Stain   Final    CYTOSPIN SMEAR WBC PRESENT, PREDOMINANTLY MONONUCLEAR NO ORGANISMS SEEN    Culture   Final    NO GROWTH 2 DAYS Performed at [facility], [address], [place], [place] [address]    Report Status PENDING  Incomplete  Acid Fast Smear (AFB)     Status: None   Collection Time: 01/28/17  3:40 PM  Result Value Ref Range Status   AFB Specimen Processing Concentration  Final   Acid Fast Smear Negative  Final    Comment: (NOTE) Performed At: [facility] [address] [place], [place] [id number] Jolene Schimke MD XB:[id number]    Source (AFB) PLEURAL  Final    Coagulation Studies: No results for input(s): LABPROT, INR in the last 72 hours.  Urinalysis: No results for input(s): COLORURINE, LABSPEC, PHURINE, GLUCOSEU, HGBUR, BILIRUBINUR, KETONESUR, PROTEINUR, UROBILINOGEN, NITRITE, LEUKOCYTESUR in the last 72 hours.  Invalid input(s): APPERANCEUR    Imaging: Dg Chest 1 View  Result Date: 01/05/2017 CLINICAL DATA:  Hypoxia per request. Thoracentesis yesterday left lung EXAM: CHEST 1 VIEW COMPARISON:  10-Jan-2017 FINDINGS: Lung volumes remain low. There is lung base opacity more evident on the left, likely atelectasis. There probable small pleural effusions. No evidence of pulmonary edema. Cardiac silhouette is mildly enlarged. Stable changes from CABG  surgery. No pneumothorax. IMPRESSION: 1. No significant change from the most recent prior study. 2. No pneumothorax. 3. Probable small effusions with left greater than right lung base opacity, likely atelectasis. No convincing pulmonary edema. Electronically Signed   By: Amie Portland M.D.   On: 01/05/2017 14:24   Dg Chest 1 View  Result Date: Jan 10, 2017 CLINICAL DATA:  Status post left-sided thoracentesis. Dementia. Hypertension. CABG. EXAM: CHEST 1 VIEW COMPARISON:  Earlier today at 1317 hours. FINDINGS: 1548 hours. Midline trachea. Cardiomegaly accentuated by AP portable technique. Prior median sternotomy. Atherosclerosis in the transverse aorta. No pneumothorax. Decrease in left-sided pleural effusion, small. Mild interstitial edema, accentuated by diminished lung volumes. Improved left base airspace disease with similar right base opacity. IMPRESSION: Decreased left-sided pleural effusion, without pneumothorax. Improved left and similar right base airspace disease, favored to represent atelectasis. Cardiomegaly with mild congestive heart failure. Electronically Signed   By: Jeronimo Greaves M.D.   On: Jan 10, 2017 16:09   Dg Chest 2 View  Result Date: 01/10/2017 CLINICAL DATA:  Shortness of breath. EXAM: CHEST  2 VIEW COMPARISON:  Radiographs of October 09, 2016. FINDINGS: Stable cardiomegaly. Status post coronary artery bypass graft. Mild right basilar subsegmental atelectasis is noted. Large left pleural effusion is noted with probable underlying atelectasis or infiltrate. Atherosclerosis of thoracic aorta is noted. Bony thorax is unremarkable. IMPRESSION: Interval development of large left pleural effusion with probable underlying atelectasis or infiltrate. Electronically Signed   By: Lupita Raider, M.D.   On: 01-10-17 13:28   Ct Head Wo Contrast  Result Date: 01/10/17 CLINICAL DATA:  Status post fall x3 over the past week. EXAM: CT HEAD WITHOUT CONTRAST TECHNIQUE: Contiguous axial images were  obtained from the base of the skull through the vertex without intravenous contrast. COMPARISON:  Head CT scan 11/02/2016. FINDINGS: Brain: Cortical atrophy and chronic microvascular ischemic change are again seen. No evidence of acute abnormality including hemorrhage, infarct, mass lesion, mass effect, midline shift or abnormal extra-axial fluid collection. No hydrocephalus or pneumocephalus. Vascular: Extensive atherosclerosis noted. Skull: Intact. Sinuses/Orbits: Negative. Other: None. IMPRESSION: No acute abnormality. Atrophy and chronic microvascular ischemic change. Extensive atherosclerosis. Electronically Signed   By: Drusilla Kanner M.D.   On: Jan 10, 2017 13:49   US Abdomen Limited  Result Date: 01-10-2017 CLINICAL DATA:  Ascites on physical examination. EXAM: LIMITED ABDOMEN ULTRASOUND FOR ASCITES TECHNIQUE: Limited ultrasound survey for ascites was performed in all four abdominal quadrants. COMPARISON:  None. FINDINGS: Survey of the peritoneal cavity demonstrates ascites of relatively small volume and primarily adjacent to the liver and in the left upper quadrant. There overall is not enough fluid to warrant paracentesis at this time. IMPRESSION: Overall small volume of ascites in the peritoneal cavity. Electronically Signed   By: Irish Lack M.D.   On: January 10, 2017 16:38   Dg Chest Port 1 View  Result Date: 01/06/2017 CLINICAL  DATA:  Shortness of breath. EXAM: PORTABLE CHEST 1 VIEW COMPARISON:  Radiograph yesterday, additional priors. FINDINGS: The heart is enlarged, unchanged from prior exam. Post median sternotomy. Left lung base opacity likely combination of pleural fluid and atelectasis or pneumonia. No pneumothorax. Vascular congestion. Possible tiny right pleural effusion. IMPRESSION: Unchanged appearance of the chest with cardiomegaly, left pleural effusion with basilar opacity and vascular congestion. Electronically Signed   By: Rubye OaksMelanie  Ehinger M.D.   On: 01/06/2017 06:01   [place]  Thoracentesis Asp Pleural Space W/img Guide  Result Date: 01/29/2017 CLINICAL DATA:  Left pleural effusion. EXAM: ULTRASOUND GUIDED LEFT THORACENTESIS COMPARISON:  Chest x-ray earlier today. PROCEDURE: An ultrasound guided thoracentesis was thoroughly discussed with the patient's wife and questions answered. The benefits, risks, alternatives and complications were also discussed. The patient's wife understands and wishes to proceed with the procedure. Written consent was obtained. Ultrasound was performed to localize and mark an adequate pocket of fluid in the left chest. The area was then prepped and draped in the normal sterile fashion. 1% Lidocaine was used for local anesthesia. Under ultrasound guidance a 6 French Safe-T-Centesis catheter was introduced. Thoracentesis was performed. The catheter was removed and a dressing applied. COMPLICATIONS: None FINDINGS: A total of approximately 1.1 L of clear, yellow fluid was removed. A fluid sample was sent for laboratory analysis. IMPRESSION: Successful ultrasound guided left thoracentesis yielding 1.1 L of pleural fluid. Electronically Signed   By: Irish LackGlenn  Yamagata M.D.   On: 23-Oct-2016 16:33     Medications:   . sodium chloride Stopped (01/06/17 0520)   . amLODipine  10 mg Oral Daily  . docusate sodium  100 mg Oral BID  . donepezil  5 mg Oral QHS  . ipratropium-albuterol  3 mL Nebulization Q6H  . lamoTRIgine  25 mg Oral Daily   acetaminophen **OR** acetaminophen, albuterol, bisacodyl, ondansetron **OR** ondansetron (ZOFRAN) IV  Assessment/ Plan:  83 y.o. male with hypertension, coronary disease, CABG in 1990, dementia, enlarged prostate, CKD stage III who presents s/p fall and now found to have acute renal failure.  1.  Acute renal failure/CKD stage III:  Suspect due to poor po intake and urinary retention.  Will place foley catheter now given post void residual of 450cc.  Will also check renal US.  Restart IVFs at 40cc/hr.    2.  Hypertension:   Continue amlodipine for now.   3.  Anemia of CKD: Hemoglobin currently 11.7, hold off on epogen at this time.    4.  Thanks for consultation.     LOS: 2 Dulcie Gammon 11/4/201812:05 PM

## 2017-01-06 NOTE — Progress Notes (Signed)
Dr Sheryle Hailiamond made aware that pt now has wheezing which is a change from start of shift, no urine output this shift, bladder scan shows 295ml in bladder, NS at 1775ml/hr, Dr Sheryle Hailiamond to place new orders

## 2017-01-06 NOTE — Care Management Note (Signed)
Case Management Note  Patient Details  Name: Ryan Villanueva MRN: [id number] Date of Birth: 10/07/1930  Subjective/Objective:      Ryan Villanueva was too somnolent to respond to questions, but his wife, Ryan Villanueva, was at bedside and provided some background information. Ryan Villanueva has dementia, does not drive, and his wife, with whom he resides, provides transportation and ADL care. Ryan Villanueva was active at home until this admission per Villanueva large pleural effusion. No chronic oxygen in the home. Ryan Villanueva has Villanueva rolling walker and Villanueva cane at home. PCP is Dr Corky DownsJaved Masoud. Pharmacy is [facility] on [address]. Ryan Villanueva is currently receiving no home health services. ( Ryan Villanueva was provided with Villanueva list of home health providers in the event that Ryan Villanueva returns home with phyorsician orders for home health services)  Ryan Villanueva is concerned that Ryan Villanueva may not recover enough to return home after discharge. Case management will follow for discharge planning.            Action/Plan:   Expected Discharge Date:  01/06/17               Expected Discharge Plan:     In-House Referral:     Discharge planning Services     Post Acute Care Choice:    Choice offered to:     DME Arranged:    DME Agency:     HH Arranged:    HH Agency:     Status of Service:     If discussed at [employer] Meetings, dates discussed:    Additional Comments:  Ryan Flott A, RN 01/06/2017, 2:59 PM

## 2017-01-06 NOTE — Progress Notes (Signed)
Notified Dr Amado CoeGouru of pt voiding 20ml, bladder scan showed 490ml. Pt did not void last night. Pt wheezes. IVF stopped during night shift.  Per MD to insert foley, NS @40ml /hr, scheduled duoneb, prn albuterol.

## 2017-01-06 NOTE — Progress Notes (Signed)
PT Cancellation Note  Patient Details Name: Ryan Villanueva MRN: [id number] DOB: 09/14/1930   Cancelled Treatment:    Reason Eval/Treat Not Completed: Patient at procedure or test/unavailable. Spoke with RN and patient was out of room for echo. Will check back when patient is available.    Katherina Rightatricia P Antionne Enrique, PT 01/06/2017, 9:36 AM

## 2017-01-06 NOTE — Plan of Care (Signed)
Pt alert, no complaints noted, resting quietly, voices any needs

## 2017-01-06 NOTE — Progress Notes (Signed)
[facility] - [place] at [facility]   PATIENT NAME: Ryan Villanueva    MR#:  [id number]  DATE OF BIRTH:  [birth year]  SUBJECTIVE:  CHIEF COMPLAINT:   Chief Complaint  Patient presents with  . Fall    had SOB, Found left sided pleural effusion, s/p thoracentesis.  Have terminal dementia, still on 4-5 ltr oxygen.  IV fluids were discontinued overnight because patient was short of breath.  Patient had 40 years of smoking history and used to smoke 2-3 packs of cigarettes.  Wheezing some. stepdaughter at bedside  REVIEW OF SYSTEMS:  Pt is demented.  ROS  DRUG ALLERGIES:  No Known Allergies  VITALS:  Blood pressure (!) 130/96, pulse (!) 42, temperature (!) 97.5 F (36.4 C), temperature source Oral, resp. rate (!) 22, height 5\' 7"  (1.702 m), weight 83.5 kg (184 lb), SpO2 98 %.  PHYSICAL EXAMINATION:  GENERAL:  83 y.o.-year-old patient lying in the bed with no acute distress.  EYES: Pupils equal, round, reactive to light and accommodation. No scleral icterus. Extraocular muscles intact.  HEENT: Head atraumatic, normocephalic. Oropharynx and nasopharynx clear.  NECK:  Supple, no jugular venous distention. No thyroid enlargement, no tenderness.  LUNGS: Normal breath sounds bilaterally, has min wheezing, no  rales,rhonchi or crepitation. No use of accessory muscles of respiration.  CARDIOVASCULAR: S1, S2 normal. No murmurs, rubs, or gallops.  ABDOMEN: Soft, nontender, nondistended. Bowel sounds present. No organomegaly or mass.  EXTREMITIES: No pedal edema, cyanosis, or clubbing.  NEUROLOGIC: Cranial nerves II through XII are intact. Muscle strength 5/5 in all extremities. Sensation intact. Gait not checked.  PSYCHIATRIC: The patient is alert, but disoriented  SKIN: No obvious rash, lesion, or ulcer.   Physical Exam LABORATORY PANEL:   CBC Recent Labs  Lab 01/05/17 0428  WBC 6.2  HGB 11.7*  HCT 36.3*  PLT 236    ------------------------------------------------------------------------------------------------------------------  Chemistries  Recent Labs  Lab January 05, 2017 1237  01/06/17 0444  NA 146*   < > 142  K 4.8   < > 5.0  CL 111   < > 110  CO2 29   < > 24  GLUCOSE 113*   < > 96  BUN 35*   < > 34*  CREATININE 2.21*   < > 2.41*  CALCIUM 8.4*   < > 8.0*  AST 43*  --   --   ALT 25  --   --   ALKPHOS 191*  --   --   BILITOT 1.0  --   --    < > = values in this interval not displayed.   ------------------------------------------------------------------------------------------------------------------  Cardiac Enzymes Recent Labs  Lab Jan 05, 2017 1237  TROPONINI 0.05*   ------------------------------------------------------------------------------------------------------------------  RADIOLOGY:  Dg Chest 1 View  Result Date: 01/05/2017 CLINICAL DATA:  Hypoxia per request. Thoracentesis yesterday left lung EXAM: CHEST 1 VIEW COMPARISON:  01-05-2017 FINDINGS: Lung volumes remain low. There is lung base opacity more evident on the left, likely atelectasis. There probable small pleural effusions. No evidence of pulmonary edema. Cardiac silhouette is mildly enlarged. Stable changes from CABG surgery. No pneumothorax. IMPRESSION: 1. No significant change from the most recent prior study. 2. No pneumothorax. 3. Probable small effusions with left greater than right lung base opacity, likely atelectasis. No convincing pulmonary edema. Electronically Signed   By: Amie Portland M.D.   On: 01/05/2017 14:24   Dg Chest 1 View  Result Date: 01-05-2017 CLINICAL DATA:  Status post left-sided thoracentesis. Dementia. Hypertension. CABG. EXAM: CHEST 1  VIEW COMPARISON:  Earlier today at 1317 hours. FINDINGS: 1548 hours. Midline trachea. Cardiomegaly accentuated by AP portable technique. Prior median sternotomy. Atherosclerosis in the transverse aorta. No pneumothorax. Decrease in left-sided pleural effusion, small.  Mild interstitial edema, accentuated by diminished lung volumes. Improved left base airspace disease with similar right base opacity. IMPRESSION: Decreased left-sided pleural effusion, without pneumothorax. Improved left and similar right base airspace disease, favored to represent atelectasis. Cardiomegaly with mild congestive heart failure. Electronically Signed   By: Jeronimo Greaves M.D.   On: 13-Jan-2017 16:09   Dg Chest 2 View  Result Date: Jan 13, 2017 CLINICAL DATA:  Shortness of breath. EXAM: CHEST  2 VIEW COMPARISON:  Radiographs of October 09, 2016. FINDINGS: Stable cardiomegaly. Status post coronary artery bypass graft. Mild right basilar subsegmental atelectasis is noted. Large left pleural effusion is noted with probable underlying atelectasis or infiltrate. Atherosclerosis of thoracic aorta is noted. Bony thorax is unremarkable. IMPRESSION: Interval development of large left pleural effusion with probable underlying atelectasis or infiltrate. Electronically Signed   By: Lupita Raider, M.D.   On: Jan 13, 2017 13:28   Ct Head Wo Contrast  Result Date: Jan 13, 2017 CLINICAL DATA:  Status post fall x3 over the past week. EXAM: CT HEAD WITHOUT CONTRAST TECHNIQUE: Contiguous axial images were obtained from the base of the skull through the vertex without intravenous contrast. COMPARISON:  Head CT scan 11/02/2016. FINDINGS: Brain: Cortical atrophy and chronic microvascular ischemic change are again seen. No evidence of acute abnormality including hemorrhage, infarct, mass lesion, mass effect, midline shift or abnormal extra-axial fluid collection. No hydrocephalus or pneumocephalus. Vascular: Extensive atherosclerosis noted. Skull: Intact. Sinuses/Orbits: Negative. Other: None. IMPRESSION: No acute abnormality. Atrophy and chronic microvascular ischemic change. Extensive atherosclerosis. Electronically Signed   By: Drusilla Kanner M.D.   On: January 13, 2017 13:49   US Abdomen Limited  Result Date:  January 13, 2017 CLINICAL DATA:  Ascites on physical examination. EXAM: LIMITED ABDOMEN ULTRASOUND FOR ASCITES TECHNIQUE: Limited ultrasound survey for ascites was performed in all four abdominal quadrants. COMPARISON:  None. FINDINGS: Survey of the peritoneal cavity demonstrates ascites of relatively small volume and primarily adjacent to the liver and in the left upper quadrant. There overall is not enough fluid to warrant paracentesis at this time. IMPRESSION: Overall small volume of ascites in the peritoneal cavity. Electronically Signed   By: Irish Lack M.D.   On: 01-13-17 16:38   Dg Chest Port 1 View  Result Date: 01/06/2017 CLINICAL DATA:  Shortness of breath. EXAM: PORTABLE CHEST 1 VIEW COMPARISON:  Radiograph yesterday, additional priors. FINDINGS: The heart is enlarged, unchanged from prior exam. Post median sternotomy. Left lung base opacity likely combination of pleural fluid and atelectasis or pneumonia. No pneumothorax. Vascular congestion. Possible tiny right pleural effusion. IMPRESSION: Unchanged appearance of the chest with cardiomegaly, left pleural effusion with basilar opacity and vascular congestion. Electronically Signed   By: Rubye Oaks M.D.   On: 01/06/2017 06:01   US Thoracentesis Asp Pleural Space W/img Guide  Result Date: 2017/01/13 CLINICAL DATA:  Left pleural effusion. EXAM: ULTRASOUND GUIDED LEFT THORACENTESIS COMPARISON:  Chest x-ray earlier today. PROCEDURE: An ultrasound guided thoracentesis was thoroughly discussed with the patient's wife and questions answered. The benefits, risks, alternatives and complications were also discussed. The patient's wife understands and wishes to proceed with the procedure. Written consent was obtained. Ultrasound was performed to localize and mark an adequate pocket of fluid in the left chest. The area was then prepped and draped in the normal sterile fashion. 1% Lidocaine  was used for local anesthesia. Under ultrasound guidance a 6  French Safe-T-Centesis catheter was introduced. Thoracentesis was performed. The catheter was removed and a dressing applied. COMPLICATIONS: None FINDINGS: A total of approximately 1.1 L of clear, yellow fluid was removed. A fluid sample was sent for laboratory analysis. IMPRESSION: Successful ultrasound guided left thoracentesis yielding 1.1 L of pleural fluid. Electronically Signed   By: Irish LackGlenn  Yamagata M.D.   On: 10/17/2016 16:33    ASSESSMENT AND PLAN:   Active Problems:   Large pleural effusion  83 year old male with a known history of coronary artery disease status post CABG, dementia, hypertension is being admitted for hypoxia there is a large left pleural effusion, ascites and acute renal failure  *Acute hypoxic respiratory failure secondary to large left-sided pleural effusion/component of COPD Status post thoracentesis Nebulizer treatments Wean off oxygen as tolerated -IR guided thoracentesis done, 1.1 ltr fluid removed. -fluid studies ordered, waiting cx report. Repeat chest x-ray with cardiomegaly, left pleural effusion with basilar opacity and vascular congestion  *Acute on chronic kidney disease stage III -Monitor with gentle hydration -Avoid nephrotoxic medication -Creatinine at 2.21, 2.42-2.41 base line at 1.35 -follows with Dr. Cherylann RatelLateef nephrology, recommending gentle hydration with IV fluids, normal saline at 40 mL/h, Foley catheter to monitor urine output Foley catheter placed for urinary retention  * Mixed Dementia, Vascular Dementia and Alzheimer's Disease:  -Continue Aricept, with Dr. Sherryll BurgerShah neurology  * History of stroke -resume Plavix and Lipitor which was held for thoracentesis -Lipitor may not be ideal with him having recurrent falls  -We will get physical and occupational therapy evaluation. he may need placement    All the records are reviewed and case discussed with Care Management/Social Workerr. Management plans discussed with the patient,  stepdaughter at bedside and they are in agreement.  CODE STATUS: DNR  TOTAL TIME TAKING CARE OF THIS PATIENT: 35 minutes.     POSSIBLE D/C IN 1-2 DAYS, DEPENDING ON CLINICAL CONDITION.   Ryan Villanueva, Ryan Villanueva M.D on 01/06/2017   Between 7am to 6pm - Pager - [phone]   After 6pm go to [email or web address] - password [email or web address] [email or web address]  [facility]  Office  [phone]  CC: Primary care physician; Corky DownsMasoud, Javed, MD  Note: This dictation was prepared with Dragon dictation along with smaller phrase technology. Any transcriptional errors that result from this process are unintentional.

## 2017-01-07 LAB — BASIC METABOLIC PANEL
ANION GAP: 3 — AB (ref 5–15)
BUN: 38 mg/dL — AB (ref 6–20)
CALCIUM: 7.1 mg/dL — AB (ref 8.9–10.3)
CO2: 26 mmol/L (ref 22–32)
Chloride: 115 mmol/L — ABNORMAL HIGH (ref 101–111)
Creatinine, Ser: 2.4 mg/dL — ABNORMAL HIGH (ref 0.61–1.24)
GFR calc Af Amer: 27 mL/min — ABNORMAL LOW (ref >60)
GFR, EST NON AFRICAN AMERICAN: 23 mL/min — AB (ref >60)
GLUCOSE: 92 mg/dL (ref 65–99)
Potassium: 4.7 mmol/L (ref 3.5–5.1)
SODIUM: 144 mmol/L (ref 135–145)

## 2017-01-07 LAB — COMP PANEL: LEUKEMIA/LYMPHOMA

## 2017-01-07 LAB — MAGNESIUM: Magnesium: 2.2 mg/dL (ref 1.7–2.4)

## 2017-01-07 MED ORDER — PREDNISONE 50 MG PO TABS
50.0000 mg | ORAL_TABLET | Freq: Every day | ORAL | Status: DC
  Administered 2017-01-07 – 2017-01-09 (×3): 50 mg via ORAL
  Filled 2017-01-07 (×3): qty 1

## 2017-01-07 MED ORDER — IPRATROPIUM-ALBUTEROL 0.5-2.5 (3) MG/3ML IN SOLN
3.0000 mL | Freq: Three times a day (TID) | RESPIRATORY_TRACT | Status: DC
  Administered 2017-01-07 – 2017-01-09 (×7): 3 mL via RESPIRATORY_TRACT
  Filled 2017-01-07 (×8): qty 3

## 2017-01-07 NOTE — Progress Notes (Signed)
[facility]  ROUNDING NOTE   Subjective:   Wife and daughter at bedside. Patient with dementia and unable to give much history.   Creatinine 2.4 (2.41)  NS at 2940mL/hr  Objective:  Vital signs in last 24 hours:  Temp:  [97.5 F (36.4 C)-98.5 F (36.9 C)] 97.9 F (36.6 C) (11/05 1355) Pulse Rate:  [41-42] 41 (11/05 1355) Resp:  [18-20] 20 (11/05 0507) BP: (135-158)/(58-90) 144/68 (11/05 1355) SpO2:  [91 %-97 %] 95 % (11/05 1422)  Weight change:  Filed Weights   January 20, 2017 1735 01/05/17 0401 01/06/17 0427  Weight: 82.1 kg (181 lb) 77.1 kg (170 lb) 83.5 kg (184 lb)    Intake/Output: I/O last 3 completed shifts: In: 240 [P.O.:240] Out: 520 [Urine:520]   Intake/Output this shift:  Total I/O In: 860 [P.O.:860] Out: -   Physical Exam: General: No acute distress, laying in bed  Head: Normocephalic, atraumatic. Moist oral mucosal membranes  Eyes: Anicteric  Neck: Supple, trachea midline  Lungs:  Left greater than right crackles  Heart: S1S2 no rubs  Abdomen:  Soft, nontender, bowel sounds present  Extremities: + peripheral edema.  Neurologic: Awake, but confused  Skin: No lesions       Basic Metabolic Panel: Recent Labs  Lab January 20, 2017 1237 01/05/17 0428 01/06/17 0444 01/07/17 0822  NA 146* 146* 142 144  K 4.8 4.6 5.0 4.7  CL 111 111 110 115*  CO2 29 28 24 26   GLUCOSE 113* 94 96 92  BUN 35* 35* 34* 38*  CREATININE 2.21* 2.42* 2.41* 2.40*  CALCIUM 8.4* 8.1* 8.0* 7.1*  MG  --   --   --  2.2    Liver Function Tests: Recent Labs  Lab January 20, 2017 1237  AST 43*  ALT 25  ALKPHOS 191*  BILITOT 1.0  PROT 6.4*  ALBUMIN 3.3*   No results for input(s): LIPASE, AMYLASE in the last 168 hours. No results for input(s): AMMONIA in the last 168 hours.  CBC: Recent Labs  Lab January 20, 2017 1237 01/05/17 0428  WBC 7.0 6.2  NEUTROABS 5.5  --   HGB 12.0* 11.7*  HCT 36.7* 36.3*  MCV 87.6 87.9  PLT 277 236    Cardiac Enzymes: Recent Labs  Lab  January 20, 2017 1237  TROPONINI 0.05*    BNP: Invalid input(s): POCBNP  CBG: Recent Labs  Lab 01/05/17 0743  GLUCAP 80    Microbiology: Results for orders placed or performed during the hospital encounter of January 20, 2017  Body fluid culture     Status: None (Preliminary result)   Collection Time: January 20, 2017  3:40 PM  Result Value Ref Range Status   Specimen Description PLEURAL  Final   Special Requests NONE  Final   Gram Stain   Final    CYTOSPIN SMEAR WBC PRESENT, PREDOMINANTLY MONONUCLEAR NO ORGANISMS SEEN    Culture   Final    NO GROWTH 3 DAYS Performed at [facility], [address] [place], [place] [address]    Report Status PENDING  Incomplete  Acid Fast Smear (AFB)     Status: None   Collection Time: January 20, 2017  3:40 PM  Result Value Ref Range Status   AFB Specimen Processing Concentration  Final   Acid Fast Smear Negative  Final    Comment: (NOTE) Performed At: [facility] [address] [place], [place] [id number] Jolene SchimkeNagendra Sanjai MD XB:[phone]    Source (AFB) PLEURAL  Final    Coagulation Studies: No results for input(s): LABPROT, INR in the last  72 hours.  Urinalysis: No results for input(s): COLORURINE, LABSPEC, PHURINE, GLUCOSEU, HGBUR, BILIRUBINUR, KETONESUR, PROTEINUR, UROBILINOGEN, NITRITE, LEUKOCYTESUR in the last 72 hours.  Invalid input(s): APPERANCEUR    Imaging: US Renal  Result Date: 01/06/2017 CLINICAL DATA:  Acute renal failure. EXAM: RENAL / URINARY TRACT ULTRASOUND COMPLETE COMPARISON:  None. FINDINGS: Right Kidney: Length: 11.1 cm.  Contains a 1.8 x 1.3 x 1.8 cm cysts. Left Kidney: Length: 11.2 cm. Echogenicity within normal limits. No mass or hydronephrosis visualized. Bladder: The bladder is decompressed with a Foley catheter limiting evaluation. IMPRESSION: No cause for acute renal failure identified. Electronically Signed   By: Gerome Sam III M.D   On: 01/06/2017 16:28   Dg Chest Port 1 View  Result Date:  01/06/2017 CLINICAL DATA:  Shortness of breath. EXAM: PORTABLE CHEST 1 VIEW COMPARISON:  Radiograph yesterday, additional priors. FINDINGS: The heart is enlarged, unchanged from prior exam. Post median sternotomy. Left lung base opacity likely combination of pleural fluid and atelectasis or pneumonia. No pneumothorax. Vascular congestion. Possible tiny right pleural effusion. IMPRESSION: Unchanged appearance of the chest with cardiomegaly, left pleural effusion with basilar opacity and vascular congestion. Electronically Signed   By: Rubye Oaks M.D.   On: 01/06/2017 06:01     Medications:    . amLODipine  10 mg Oral Daily  . docusate sodium  100 mg Oral BID  . donepezil  5 mg Oral QHS  . ipratropium-albuterol  3 mL Nebulization TID  . lamoTRIgine  25 mg Oral Daily  . predniSONE  50 mg Oral Q breakfast   acetaminophen **OR** acetaminophen, albuterol, bisacodyl, ondansetron **OR** ondansetron (ZOFRAN) IV  Assessment/ Plan:   [name] is a 83 year old white male with hypertension, coronary artery disease with CABG, dementia, BPH  1.  Acute renal failure on chronic kidney disease stage III:  Acute renal failure secondary to acute cardiorenal syndrome and obstructive uropathy.  Urine output improved with foley catheter placement - Discontinue IV fluids - Ultrasound reviewed - Start tamsulosin - Monitor urine output and renal function   2.  Hypertension:  Blood pressure at goal but with peripheral edema. Echocardiogram with diastolic dysfunction - may need to hold amlodipine due to edema - Tamsulosin as above - Discussed role of diuretics   LOS: 3 [name] 11/5/20183:22 PM

## 2017-01-07 NOTE — Progress Notes (Signed)
[employer] - [place] at [facility]   PATIENT NAME: Ryan Villanueva    MR#:  [id number]  DATE OF BIRTH:  December 13, 1930  SUBJECTIVE:  CHIEF COMPLAINT:   Chief Complaint  Patient presents with  . Fall    had SOB, Found left sided pleural effusion, s/p thoracentesis.  Have terminal dementia, still on 5 ltr oxygen.   Patient had 40 years of smoking history and used to smoke 2-3 packs of cigarettes. stepdaughter at bedside Patient is urinating well.  Wheezing some  REVIEW OF SYSTEMS:  Pt is demented.  ROS  DRUG ALLERGIES:  No Known Allergies  VITALS:  Blood pressure (!) 144/68, pulse (!) 41, temperature 97.9 F (36.6 C), temperature source Oral, resp. rate 20, height 5\' 7"  (1.702 m), weight 83.5 kg (184 lb), SpO2 95 %.  PHYSICAL EXAMINATION:  GENERAL:  83 y.o.-year-old patient lying in the bed with no acute distress.  EYES: Pupils equal, round, reactive to light and accommodation. No scleral icterus. Extraocular muscles intact.  HEENT: Head atraumatic, normocephalic. Oropharynx and nasopharynx clear.  NECK:  Supple, no jugular venous distention. No thyroid enlargement, no tenderness.  LUNGS: Normal breath sounds bilaterally, has min wheezing, no  rales,rhonchi or crepitation. No use of accessory muscles of respiration.  CARDIOVASCULAR: S1, S2 normal. No murmurs, rubs, or gallops.  ABDOMEN: Soft, nontender, nondistended. Bowel sounds present. No organomegaly or mass.  EXTREMITIES: No pedal edema, cyanosis, or clubbing.  NEUROLOGIC: Cranial nerves II through XII are intact. Muscle strength 5/5 in all extremities. Sensation intact. Gait not checked.  PSYCHIATRIC: The patient is alert, but disoriented  SKIN: No obvious rash, lesion, or ulcer.   Physical Exam LABORATORY PANEL:   CBC Recent Labs  Lab 01/05/17 0428  WBC 6.2  HGB 11.7*  HCT 36.3*  PLT 236    ------------------------------------------------------------------------------------------------------------------  Chemistries  Recent Labs  Lab 01/06/2017 1237  01/07/17 0822  NA 146*   < > 144  K 4.8   < > 4.7  CL 111   < > 115*  CO2 29   < > 26  GLUCOSE 113*   < > 92  BUN 35*   < > 38*  CREATININE 2.21*   < > 2.40*  CALCIUM 8.4*   < > 7.1*  MG  --   --  2.2  AST 43*  --   --   ALT 25  --   --   ALKPHOS 191*  --   --   BILITOT 1.0  --   --    < > = values in this interval not displayed.   ------------------------------------------------------------------------------------------------------------------  Cardiac Enzymes Recent Labs  Lab 01/17/2017 1237  TROPONINI 0.05*   ------------------------------------------------------------------------------------------------------------------  RADIOLOGY:  US Renal  Result Date: 01/06/2017 CLINICAL DATA:  Acute renal failure. EXAM: RENAL / URINARY TRACT ULTRASOUND COMPLETE COMPARISON:  None. FINDINGS: Right Kidney: Length: 11.1 cm.  Contains a 1.8 x 1.3 x 1.8 cm cysts. Left Kidney: Length: 11.2 cm. Echogenicity within normal limits. No mass or hydronephrosis visualized. Bladder: The bladder is decompressed with a Foley catheter limiting evaluation. IMPRESSION: No cause for acute renal failure identified. Electronically Signed   By: Gerome Sam III M.D   On: 01/06/2017 16:28   Dg Chest Port 1 View  Result Date: 01/06/2017 CLINICAL DATA:  Shortness of breath. EXAM: PORTABLE CHEST 1 VIEW COMPARISON:  Radiograph yesterday, additional priors. FINDINGS: The heart is enlarged, unchanged from prior exam. Post median sternotomy. Left lung base opacity likely combination of  pleural fluid and atelectasis or pneumonia. No pneumothorax. Vascular congestion. Possible tiny right pleural effusion. IMPRESSION: Unchanged appearance of the chest with cardiomegaly, left pleural effusion with basilar opacity and vascular congestion. Electronically  Signed   By: Rubye OaksMelanie  Ehinger M.D.   On: 01/06/2017 06:01    ASSESSMENT AND PLAN:   Active Problems:   Large pleural effusion  83 year old male with a known history of coronary artery disease status post CABG, dementia, hypertension is being admitted for hypoxia there is a large left pleural effusion, ascites and acute renal failure  *Acute hypoxic respiratory failure secondary to large left-sided pleural effusion/component of COPD Status post thoracentesis Nebulizer treatments Wean off oxygen as tolerated, still on 5-6 L of oxygen -IR guided thoracentesis done, 1.1 ltr fluid removed. -fluid studies ordered, waiting cx report. Repeat chest x-ray with cardiomegaly, left pleural effusion with basilar opacity and vascular congestion  *Acute on chronic kidney disease stage III -Monitor with gentle hydration -Avoid nephrotoxic medication -Creatinine at 2.21, 2.42-2.41--2.40 base line at 1.35 -follow with  nephrology, discontinue IV fluids, Foley catheter to monitor urine output Foley catheter placed for urinary retention,  * Mixed Dementia, Vascular Dementia and Alzheimer's Disease:  -Continue Aricept, with Dr. Sherryll BurgerShah neurology  * History of stroke -resume Plavix and Lipitor which was held for thoracentesis -Lipitor may not be ideal with him having recurrent falls  -We will get physical and occupational therapy evaluation. he may need placement    All the records are reviewed and case discussed with Care Management/Social Workerr. Management plans discussed with the patient, stepdaughter over phone and wife at bedside and they are in agreement.  CODE STATUS: DNR  TOTAL TIME TAKING CARE OF THIS PATIENT: 35 minutes.     POSSIBLE D/C IN 1-2 DAYS, DEPENDING ON CLINICAL CONDITION.   Ramonita LabGouru, Dyneisha Murchison M.D on 01/07/2017   Between 7am to 6pm - Pager - [phone]   After 6pm go to [email or web address] - password [email or web address] [email or web address]  [facility] [place] [facility]  Office   [phone]  CC: Primary care physician; Corky DownsMasoud, Javed, MD  Note: This dictation was prepared with Dragon dictation along with smaller phrase technology. Any transcriptional errors that result from this process are unintentional.

## 2017-01-07 NOTE — Evaluation (Signed)
Physical Therapy Evaluation Patient Details Name: Ryan BurdockRufus L Villanueva MRN: [id number] DOB: 06/28/1930 Today's Date: 01/07/2017   History of Present Illness  Pt is an 83 y.o. male presenting to hospital with 3 falls in 1 week, increased LE swelling, and hypoxia.  Pt admitted with large L pleural effusion, ascites, acute renal failure with urinary retention.  S/p thoracentesis (1.1 L).  PMH includes CAD, stroke, a-flutter, Alzheimer's dementia, htn, CABG, and h/o bradycardia.  Clinical Impression  Prior to hospital admission, pt was ambulating independently (h/o of "stumbling" but no falls until most recently).  Pt lives with his wife in 1 level home with stairs to enter.  Currently pt is mod assist with bed mobility and requires intermittent assist for sitting balance.  Pt's O2 sats 90% on 6 L resting in bed and decreased to 88% on 6 L sitting edge of bed and pt noted to be moderately SOB after a few minutes sitting; d/t this pt assisted laying back into bed with HOB elevated and O2 sats 90% on 6 L end of session (nursing notified).  Pt appears restless and impulsive during session requiring encouragement and cueing for safety.  Pt would benefit from skilled PT to address noted impairments and functional limitations (see below for any additional details).  Upon hospital discharge, recommend pt discharge to STR.    Follow Up Recommendations SNF    Equipment Recommendations  Rolling walker with 5" wheels    Recommendations for Other Services       Precautions / Restrictions Precautions Precautions: Fall Restrictions Weight Bearing Restrictions: No      Mobility  Bed Mobility Overal bed mobility: Needs Assistance Bed Mobility: Supine to Sit;Sit to Supine     Supine to sit: Mod assist;HOB elevated Sit to supine: Mod assist;HOB elevated   General bed mobility comments: assist for trunk and B LE's supine to/from sit; vc's for safety and technique; increased effort for pt to  perform  Transfers                 General transfer comment: Deferred d/t pt's O2 desaturation to 88% sitting edge of bed on 6 L O2 via nasal cannula and pt SOB  Ambulation/Gait             General Gait Details: Deferred d/t pt's O2 desaturation to 88% sitting edge of bed on 6 L O2 via nasal cannula and pt SOB  Stairs            Wheelchair Mobility    Modified Rankin (Stroke Patients Only)       Balance Overall balance assessment: Needs assistance Sitting-balance support: Bilateral upper extremity supported;Feet supported Sitting balance-Leahy Scale: Poor Sitting balance - Comments: intermittent posterior lean requiring min to mod assist to correct Postural control: Posterior lean                                   Pertinent Vitals/Pain Pain Assessment: Faces Faces Pain Scale: No hurt Pain Intervention(s): Limited activity within patient's tolerance;Monitored during session  HR in 40's to 50's BPM during session (pt with h/o bradycardia).    Home Living Family/patient expects to be discharged to:: Private residence Living Arrangements: Spouse/significant other Available Help at Discharge: Family Type of Home: House Home Access: Stairs to enter Entrance Stairs-Rails: Right;Left;Can reach both Entrance Stairs-Number of Steps: 3 Home Layout: One level Home Equipment: Walker - 2 wheels;Cane - single point;Grab bars - tub/shower  Prior Function Level of Independence: Needs assistance   Gait / Transfers Assistance Needed: Ambulating without AD independently.  Pt's wife reports pt has a h/o "stumbling" but not falling until recently.  ADL's / Homemaking Assistance Needed: Assist for making meals and for taking medications.  Wife drives pt.  Independent with showers and eating (and most things in general per pt's wife)        Hand Dominance        Extremity/Trunk Assessment   Upper Extremity Assessment Upper Extremity Assessment:  Difficult to assess due to impaired cognition    Lower Extremity Assessment Lower Extremity Assessment: Difficult to assess due to impaired cognition;Generalized weakness    Cervical / Trunk Assessment Cervical / Trunk Assessment: Normal  Communication   Communication: HOH  Cognition Arousal/Alertness: Awake/alert Behavior During Therapy: Impulsive;Restless Overall Cognitive Status: History of cognitive impairments - at baseline(Oriented to person)                                        General Comments General comments (skin integrity, edema, etc.): Pt's wife present entire session and encouraging pt during session.  Nursing cleared pt for participation in physical therapy.  Pt and pt's wife agreeable to PT session.    Exercises     Assessment/Plan    PT Assessment Patient needs continued PT services  PT Problem List Decreased strength;Decreased activity tolerance;Decreased balance;Decreased mobility;Decreased knowledge of precautions;Cardiopulmonary status limiting activity       PT Treatment Interventions DME instruction;Gait training;Stair training;Functional mobility training;Therapeutic activities;Therapeutic exercise;Balance training;Patient/family education    PT Goals (Current goals can be found in the Care Plan section)  Acute Rehab PT Goals Patient Stated Goal: to go home PT Goal Formulation: With patient/family Time For Goal Achievement: 01/21/17 Potential to Achieve Goals: Fair    Frequency Min 2X/week   Barriers to discharge Decreased caregiver support      Co-evaluation               AM-PAC PT "6 Clicks" Daily Activity  Outcome Measure Difficulty turning over in bed (including adjusting bedclothes, sheets and blankets)?: Unable Difficulty moving from lying on back to sitting on the side of the bed? : Unable Difficulty sitting down on and standing up from a chair with arms (e.g., wheelchair, bedside commode, etc,.)?: Unable Help  needed moving to and from a bed to chair (including a wheelchair)?: A Lot Help needed walking in hospital room?: Total Help needed climbing 3-5 steps with a railing? : Total 6 Click Score: 7    End of Session Equipment Utilized During Treatment: Oxygen(6 L via nasal cannula) Activity Tolerance: Patient limited by fatigue;Other (comment)(Limited d/t SOB and O2 desaturation) Patient left: in bed;with call bell/phone within reach;with bed alarm set;with family/visitor present;Other (comment)(bed in lowest position) Nurse Communication: Mobility status;Precautions;Other (comment)(Precautions; O2 saturations during session) PT Visit Diagnosis: Other abnormalities of gait and mobility (R26.89);Repeated falls (R29.6);Muscle weakness (generalized) (M62.81)    Time: 1610-9604 PT Time Calculation (min) (ACUTE ONLY): 36 min   Charges:   PT Evaluation $PT Eval Low Complexity: 1 Low PT Treatments $Therapeutic Exercise: 8-22 mins   PT G Codes:   PT G-Codes **NOT FOR INPATIENT CLASS** Functional Assessment Tool Used: AM-PAC 6 Clicks Basic Mobility Functional Limitation: Mobility: Walking and moving around Mobility: Walking and Moving Around Current Status (V4098): At least 80 percent but less than 100 percent impaired, limited or restricted Mobility:  Walking and Moving Around Goal Status [phone]): At least 1 percent but less than 20 percent impaired, limited or restricted    Hendricks Limes, PT 01/07/17, 1:44 PM [phone]

## 2017-01-07 NOTE — Evaluation (Signed)
Occupational Therapy Evaluation Patient Details Name: Ryan Villanueva MRN: [id number] DOB: Mar 25, 1930 Today's Date: 01/07/2017    History of Present Illness Pt is an 83 y.o. male presenting to hospital with 3 falls in 1 week, increased LE swelling, and hypoxia.  Pt admitted with large L pleural effusion, ascites, acute renal failure with urinary retention.  S/p thoracentesis (1.1 L).  PMH includes CAD, stroke, a-flutter, Alzheimer's dementia, htn, CABG, and h/o bradycardia.   Clinical Impression   Pt seen for OT evaluation this date. Prior to hospital admission, pt was generally independent with basic self care, requiring assist from spouse for meal prep and medication mgt. Pt was indep with ambulation with no AD, however spouse reports multiple falls in past couple weeks. Currently pt primarily limited by cardiopulmonary status, no home O2 use, requiring 6L O2 for 91-92% SpO2 at rest in bed. Pt/spouse educated in home/routines modifications to maximize safety and prevent falls as well as pursed lip breathing to minimize SOB. Spouse verbalized understanding and verbalized plan to provide pt with verbal cues as needed to implement PLB. Pt would benefit from skilled OT to address noted impairments and functional limitations (see below for any additional details).  Upon hospital discharge, recommend pt discharge to STR.    Follow Up Recommendations  SNF    Equipment Recommendations  Tub/shower seat;3 in 1 bedside commode    Recommendations for Other Services       Precautions / Restrictions Precautions Precautions: Fall Restrictions Weight Bearing Restrictions: No      Mobility Bed Mobility Overal bed mobility: Needs Assistance Bed Mobility: Supine to Sit;Sit to Supine     Supine to sit: Mod assist;HOB elevated Sit to supine: Mod assist;HOB elevated   General bed mobility comments: assist for trunk and B LE's supine to/from sit; vc's for safety and technique; increased  effort/time  Transfers                 General transfer comment: deferred    Balance Overall balance assessment: Needs assistance Sitting-balance support: Bilateral upper extremity supported;Feet supported Sitting balance-Leahy Scale: Poor Sitting balance - Comments: intermittent posterior lean requiring min to mod assist to correct Postural control: Posterior lean                                 ADL either performed or assessed with clinical judgement   ADL Overall ADL's : Needs assistance/impaired Eating/Feeding: Sitting;Set up;Supervision/ safety   Grooming: Sitting;Set up   Upper Body Bathing: Bed level;Moderate assistance   Lower Body Bathing: Bed level;Maximal assistance   Upper Body Dressing : Sitting;Minimal assistance   Lower Body Dressing: Bed level;Moderate assistance                       Vision Baseline Vision/History: Wears glasses Wears Glasses: At all times Patient Visual Report: No change from baseline Vision Assessment?: No apparent visual deficits     Perception     Praxis      Pertinent Vitals/Pain Pain Assessment: No/denies pain Faces Pain Scale: No hurt Pain Intervention(s): Limited activity within patient's tolerance;Monitored during session     Hand Dominance Right   Extremity/Trunk Assessment Upper Extremity Assessment Upper Extremity Assessment: Overall WFL for tasks assessed(grossly 4+/5, 5/5 grip bilaterally)   Lower Extremity Assessment Lower Extremity Assessment: Defer to PT evaluation;Generalized weakness;Difficult to assess due to impaired cognition   Cervical / Trunk Assessment Cervical / Trunk  Assessment: Normal   Communication Communication Communication: HOH;Other (comment);Receptive difficulties(dementia)   Cognition Arousal/Alertness: Awake/alert Behavior During Therapy: WFL for tasks assessed/performed Overall Cognitive Status: History of cognitive impairments - at baseline                                  General Comments: oriented to person, generally to place; increased time/cues to follow simple commands   General Comments  spouse present for entire session, additional family/friends in at end of session    Exercises Other Exercises Other Exercises: Pt/spouse educated in pursed lip breathing to support O2 sats and SOB with verbal instruction and visual demonstration. Spouse verbalized understanding. Unclear of learning by patient 2:2 dementia   Shoulder Instructions      Home Living Family/patient expects to be discharged to:: Private residence Living Arrangements: Spouse/significant other Available Help at Discharge: Family Type of Home: House Home Access: Stairs to enter Secretary/administratorntrance Stairs-Number of Steps: 3 Entrance Stairs-Rails: Right;Left;Can reach both Home Layout: One level     Bathroom Shower/Tub: Chief Strategy OfficerTub/shower unit   Bathroom Toilet: Standard     Home Equipment: Environmental consultantWalker - 2 wheels;Cane - single point;Grab bars - tub/shower;Hand held shower head          Prior Functioning/Environment Level of Independence: Needs assistance  Gait / Transfers Assistance Needed: Ambulating without AD independently.  Pt's wife reports pt has a h/o "stumbling" but not falling until recently. ADL's / Homemaking Assistance Needed: Assist for making meals and for taking medications.  Wife drives pt. Independent with showers, bathing, toileting, and eating (and most things in general per pt's wife)   Comments: >3 falls in past 2 weeks        OT Problem List: Decreased activity tolerance;Decreased cognition;Decreased safety awareness;Impaired balance (sitting and/or standing)      OT Treatment/Interventions: Self-care/ADL training;Therapeutic activities;Energy conservation;DME and/or AE instruction;Patient/family education    OT Goals(Current goals can be found in the care plan section) Acute Rehab OT Goals Patient Stated Goal: to go home OT Goal  Formulation: With patient/family Time For Goal Achievement: 01/21/17 Potential to Achieve Goals: Good  OT Frequency: Min 1X/week   Barriers to D/C:            Co-evaluation              AM-PAC PT "6 Clicks" Daily Activity     Outcome Measure Help from another person eating meals?: A Little Help from another person taking care of personal grooming?: A Little Help from another person toileting, which includes using toliet, bedpan, or urinal?: A Lot Help from another person bathing (including washing, rinsing, drying)?: A Lot Help from another person to put on and taking off regular upper body clothing?: A Little Help from another person to put on and taking off regular lower body clothing?: A Lot 6 Click Score: 15   End of Session    Activity Tolerance: Patient tolerated treatment well Patient left: in bed;with call bell/phone within reach;with bed alarm set;with family/visitor present  OT Visit Diagnosis: Other abnormalities of gait and mobility (R26.89);Repeated falls (R29.6)                Time: 1346-1400 OT Time Calculation (min): 14 min Charges:  OT General Charges $OT Visit: 1 Visit OT Evaluation $OT Eval Low Complexity: 1 Low G-Codes: OT G-codes **NOT FOR INPATIENT CLASS** Functional Assessment Tool Used: AM-PAC 6 Clicks Daily Activity;Clinical judgement Functional Limitation: Self care Self  Care Current Status [phone]): At least 40 percent but less than 60 percent impaired, limited or restricted Self Care Goal Status (W0981): At least 20 percent but less than 40 percent impaired, limited or restricted   Richrd Prime, MPH, MS, OTR/L ascom [phone] 01/07/17, 2:28 PM

## 2017-01-07 NOTE — Progress Notes (Signed)
OT Cancellation Note  Patient Details Name: Ryan BurdockRufus L Abebe MRN: [id number] DOB: 12/28/1930   Cancelled Treatment:    Reason Eval/Treat Not Completed: Other (comment). Chart reviewed. Upon attempt, pt eating breakfast with wife. Spouse requesting OT come back at later time to give pt a chance to eat breakfast, as she states pt wasn't able to eat lunch yesterday due to so much going on. Will re-attempt at later time this date.  Richrd PrimeJamie Stiller, MPH, MS, OTR/L ascom [phone] 01/07/17, 9:15 AM

## 2017-01-07 NOTE — Progress Notes (Signed)
Dr Amado CoeGouru notified that patient is having pairs of PVC's with frequent pauses. Longest pause 2.50.

## 2017-01-07 NOTE — Care Management Note (Signed)
Case Management Note  Patient Details  Name: Ryan Villanueva MRN: [id number] Date of Birth: 04/08/1930  Subjective/Objective:                 Admitted from home with weakness, falls, found to be hypoxic on room air and no home oxygen.  Thoracentesis for pleural effusion with removal of 1.1 liters of fluid.  Current with his MD and has walker in the home.  No issues obtaining medications.  Wife is primary caregiver.  Patient with dementia and DNR.  PT and OT consults are pending   Action/Plan:   Expected Discharge Date:  01/06/17               Expected Discharge Plan:     In-House Referral:     Discharge planning Services     Post Acute Care Choice:    Choice offered to:     DME Arranged:    DME Agency:     HH Arranged:    HH Agency:     Status of Service:     If discussed at [employer] Meetings, dates discussed:    Additional Comments:  Eber HongGreene, [name], RN 01/07/2017, 8:24 AM

## 2017-01-08 LAB — BASIC METABOLIC PANEL
ANION GAP: 5 (ref 5–15)
BUN: 51 mg/dL — ABNORMAL HIGH (ref 6–20)
CALCIUM: 7.8 mg/dL — AB (ref 8.9–10.3)
CO2: 27 mmol/L (ref 22–32)
Chloride: 108 mmol/L (ref 101–111)
Creatinine, Ser: 2.89 mg/dL — ABNORMAL HIGH (ref 0.61–1.24)
GFR, EST AFRICAN AMERICAN: 22 mL/min — AB (ref >60)
GFR, EST NON AFRICAN AMERICAN: 19 mL/min — AB (ref >60)
GLUCOSE: 154 mg/dL — AB (ref 65–99)
Potassium: 5.7 mmol/L — ABNORMAL HIGH (ref 3.5–5.1)
Sodium: 140 mmol/L (ref 135–145)

## 2017-01-08 LAB — BODY FLUID CULTURE: CULTURE: NO GROWTH

## 2017-01-08 MED ORDER — MEMANTINE HCL 5 MG PO TABS
5.0000 mg | ORAL_TABLET | Freq: Every day | ORAL | Status: DC
  Administered 2017-01-08 – 2017-01-09 (×2): 5 mg via ORAL
  Filled 2017-01-08 (×2): qty 1

## 2017-01-08 MED ORDER — FUROSEMIDE 20 MG PO TABS
20.0000 mg | ORAL_TABLET | Freq: Every day | ORAL | Status: DC
  Administered 2017-01-08 – 2017-01-09 (×2): 20 mg via ORAL
  Filled 2017-01-08 (×2): qty 1

## 2017-01-08 MED ORDER — TAMSULOSIN HCL 0.4 MG PO CAPS
0.8000 mg | ORAL_CAPSULE | Freq: Every day | ORAL | Status: DC
  Administered 2017-01-08: 0.8 mg via ORAL
  Filled 2017-01-08: qty 2

## 2017-01-08 MED ORDER — FUROSEMIDE 10 MG/ML IJ SOLN
40.0000 mg | Freq: Two times a day (BID) | INTRAMUSCULAR | Status: DC
  Administered 2017-01-08 – 2017-01-09 (×2): 40 mg via INTRAVENOUS
  Filled 2017-01-08 (×2): qty 4

## 2017-01-08 MED ORDER — SODIUM POLYSTYRENE SULFONATE 15 GM/60ML PO SUSP
30.0000 g | Freq: Once | ORAL | Status: AC
  Administered 2017-01-08: 14:00:00 30 g via ORAL
  Filled 2017-01-08: qty 120

## 2017-01-08 MED ORDER — LAMOTRIGINE 25 MG PO TABS
25.0000 mg | ORAL_TABLET | Freq: Two times a day (BID) | ORAL | Status: DC
  Administered 2017-01-08 – 2017-01-09 (×2): 25 mg via ORAL
  Filled 2017-01-08 (×2): qty 1

## 2017-01-08 NOTE — Progress Notes (Signed)
PT Cancellation Note  Patient Details Name: Ryan Villanueva MRN: [id number] DOB: 03/07/1930   Cancelled Treatment:    Reason Eval/Treat Not Completed: Medical issues which prohibited therapy.  Pt's potassium noted to be elevated at 5.7 today.  Per PT guidelines for elevated potassium, will hold PT today and re-attempt PT session at a later date/time as medically appropriate.  Hendricks LimesEmily Kayden Amend, PT 01/08/17, 4:45 PM [phone]

## 2017-01-08 NOTE — Plan of Care (Signed)
Pt continues to have some swelling. Denies pain. Did not want to get oob today.  Wife at bedside.

## 2017-01-08 NOTE — Care Management Important Message (Signed)
Important Message  Patient Details  Name: Ryan BurdockRufus L Cupo MRN: [id number] Date of Birth: 06/28/1930   Medicare Important Message Given:  Yes    Gwenette GreetBrenda S Lakiesha Ralphs, RN 01/08/2017, 7:06 AM

## 2017-01-08 NOTE — Progress Notes (Signed)
OT Cancellation Note  Patient Details Name: Ryan Villanueva MRN: [id number] DOB: 10/09/1930   Cancelled Treatment:    Reason Eval/Treat Not Completed: Medical issues which prohibited therapy. Chart reviewed. Pt noted to have critical lab value for K+ (5.7). Contraindicated for therapy at this time. Will hold OT this date and re-attempt next date as medically appropriate.  Richrd PrimeJamie Stiller, MPH, MS, OTR/L ascom [phone] 01/08/17, 12:55 PM

## 2017-01-08 NOTE — Progress Notes (Addendum)
[facility]  ROUNDING NOTE   Subjective:   Creatinine 2.89 (2.4) K 5.7 - kayexalate given.   Complains of weeping edema.   Wife at bedside.   Objective:  Vital signs in last 24 hours:  Temp:  [97.6 F (36.4 C)-97.8 F (36.6 C)] 97.6 F (36.4 C) (11/06 0435) Pulse Rate:  [39-41] 39 (11/06 0435) Resp:  [19] 19 (11/06 0435) BP: (145-158)/(61-89) 145/89 (11/06 0435) SpO2:  [90 %-95 %] 95 % (11/06 0435) Weight:  [85.7 kg (189 lb)] 85.7 kg (189 lb) (11/06 0435)  Weight change:  Filed Weights   01/05/17 0401 01/06/17 0427 01/08/17 0435  Weight: 77.1 kg (170 lb) 83.5 kg (184 lb) 85.7 kg (189 lb)    Intake/Output: I/O last 3 completed shifts: In: 1100 [P.O.:1100] Out: 600 [Urine:600]   Intake/Output this shift:  Total I/O In: 240 [P.O.:240] Out: -   Physical Exam: General: No acute distress, laying in bed  Head: Normocephalic, atraumatic. Moist oral mucosal membranes  Eyes: Anicteric  Neck: Supple, trachea midline  Lungs:  Left greater than right crackles  Heart: S1S2 no rubs  Abdomen:  Soft, nontender, bowel sounds present  Extremities: + peripheral edema.  Neurologic: Awake, but confused  Skin: No lesions       Basic Metabolic Panel: Recent Labs  Lab 02-02-2017 1237 01/05/17 0428 01/06/17 0444 01/07/17 0822 01/08/17 0403  NA 146* 146* 142 144 140  K 4.8 4.6 5.0 4.7 5.7*  CL 111 111 110 115* 108  CO2 29 28 24 26 27   GLUCOSE 113* 94 96 92 154*  BUN 35* 35* 34* 38* 51*  CREATININE 2.21* 2.42* 2.41* 2.40* 2.89*  CALCIUM 8.4* 8.1* 8.0* 7.1* 7.8*  MG  --   --   --  2.2  --     Liver Function Tests: Recent Labs  Lab 2017/02/02 1237  AST 43*  ALT 25  ALKPHOS 191*  BILITOT 1.0  PROT 6.4*  ALBUMIN 3.3*   No results for input(s): LIPASE, AMYLASE in the last 168 hours. No results for input(s): AMMONIA in the last 168 hours.  CBC: Recent Labs  Lab 02-02-17 1237 01/05/17 0428  WBC 7.0 6.2  NEUTROABS 5.5  --   HGB 12.0* 11.7*  HCT  36.7* 36.3*  MCV 87.6 87.9  PLT 277 236    Cardiac Enzymes: Recent Labs  Lab 2017/02/02 1237  TROPONINI 0.05*    BNP: Invalid input(s): POCBNP  CBG: Recent Labs  Lab 01/05/17 0743  GLUCAP 80    Microbiology: Results for orders placed or performed during the hospital encounter of 02-Feb-2017  Body fluid culture     Status: None   Collection Time: 2017/02/02  3:40 PM  Result Value Ref Range Status   Specimen Description PLEURAL  Final   Special Requests NONE  Final   Gram Stain   Final    CYTOSPIN SMEAR WBC PRESENT, PREDOMINANTLY MONONUCLEAR NO ORGANISMS SEEN    Culture   Final    NO GROWTH 3 DAYS Performed at [facility], [address] [place], [place] [address]    Report Status 01/08/2017 FINAL  Final  Fungus Culture With Stain     Status: None (Preliminary result)   Collection Time: February 02, 2017  3:40 PM  Result Value Ref Range Status   Fungus Stain Final report  Final    Comment: (NOTE) Performed At: [facility] [address] [place], [place] [id number] Jolene Schimke MD ON:[id number]    Fungus (Mycology) Culture PENDING  Incomplete   Fungal Source PLEURAL  Final  Acid Fast Smear (AFB)     Status: None   Collection Time: December 11, 2016  3:40 PM  Result Value Ref Range Status   AFB Specimen Processing Concentration  Final   Acid Fast Smear Negative  Final    Comment: (NOTE) Performed At: [facility] [address] [place], [place] [id number] Jolene SchimkeNagendra Sanjai MD NW:[phone]    Source (AFB) PLEURAL  Final  Fungus Culture Result     Status: None   Collection Time: December 11, 2016  3:40 PM  Result Value Ref Range Status   Result 1 Comment  Final    Comment: (NOTE) KOH/Calcofluor preparation:  no fungus observed. Performed At: [facility] [address] [place], [place] [id number] Jolene SchimkeNagendra Sanjai MD BM:[phone]     Coagulation Studies: No results for input(s): LABPROT, INR in the last 72 hours.  Urinalysis: No results for  input(s): COLORURINE, LABSPEC, PHURINE, GLUCOSEU, HGBUR, BILIRUBINUR, KETONESUR, PROTEINUR, UROBILINOGEN, NITRITE, LEUKOCYTESUR in the last 72 hours.  Invalid input(s): APPERANCEUR    Imaging: No results found.   Medications:    . amLODipine  10 mg Oral Daily  . docusate sodium  100 mg Oral BID  . furosemide  40 mg Intravenous Q12H  . furosemide  20 mg Oral Daily  . ipratropium-albuterol  3 mL Nebulization TID  . lamoTRIgine  25 mg Oral BID  . memantine  5 mg Oral Daily  . predniSONE  50 mg Oral Q breakfast  . tamsulosin  0.8 mg Oral QPC supper   acetaminophen **OR** acetaminophen, albuterol, bisacodyl, ondansetron **OR** ondansetron (ZOFRAN) IV  Assessment/ Plan:   [name] is a 83 year old white male with hypertension, coronary artery disease with CABG, dementia, BPH  1.  Acute renal failure with hyperkalemia on chronic kidney disease stage III:  Acute renal failure secondary to acute cardiorenal syndrome and obstructive uropathy.  Urine output improved with foley catheter placement - Start IV furosemide.  - Start tamsulosin - Monitor urine output and renal function   2.  Hypertension:  Blood pressure at goal but with peripheral edema/anasarca Echocardiogram with diastolic dysfunction - may need to hold amlodipine due to edema - Tamsulosin as above - Discussed role of diuretics. Started IV furosemide 40mg  q12.    LOS: 4 Ryan Villanueva 11/6/20184:05 PM

## 2017-01-08 NOTE — Progress Notes (Signed)
[employer] - [place] at [facility]   PATIENT NAME: Ryan HansenRufus Villanueva    MR#:  [id number]  DATE OF BIRTH:  04/07/1930  SUBJECTIVE:  CHIEF COMPLAINT:   Chief Complaint  Patient presents with  . Fall    had SOB, Found left sided pleural effusion, s/p thoracentesis.  Have terminal dementia, still on 5-6 ltr oxygen.   Patient had 40 years of smoking history and used to smoke 2-3 packs of cigarettes. stepdaughter at bedside Patient is urinating well.  Wheezing some  REVIEW OF SYSTEMS:  Pt is demented.  ROS  DRUG ALLERGIES:  No Known Allergies  VITALS:  Blood pressure (!) 145/89, pulse (!) 39, temperature 97.6 F (36.4 C), temperature source Oral, resp. rate 19, height 5\' 7"  (1.702 m), weight 85.7 kg (189 lb), SpO2 95 %.  PHYSICAL EXAMINATION:  GENERAL:  83 y.o.-year-old patient lying in the bed with no acute distress.  EYES: Pupils equal, round, reactive to light and accommodation. No scleral icterus. Extraocular muscles intact.  HEENT: Head atraumatic, normocephalic. Oropharynx and nasopharynx clear.  NECK:  Supple, no jugular venous distention. No thyroid enlargement, no tenderness.  LUNGS: Normal breath sounds bilaterally, has min wheezing, no  rales,rhonchi or crepitation. No use of accessory muscles of respiration.  CARDIOVASCULAR: S1, S2 normal. No murmurs, rubs, or gallops.  ABDOMEN: Soft, nontender, nondistended. Bowel sounds present. No organomegaly or mass.  EXTREMITIES: No pedal edema, cyanosis, or clubbing.  NEUROLOGIC: Cranial nerves II through XII are intact. Muscle strength 5/5 in all extremities. Sensation intact. Gait not checked.  PSYCHIATRIC: The patient is alert, but disoriented  SKIN: No obvious rash, lesion, or ulcer.   Physical Exam LABORATORY PANEL:   CBC Recent Labs  Lab 01/05/17 0428  WBC 6.2  HGB 11.7*  HCT 36.3*  PLT 236    ------------------------------------------------------------------------------------------------------------------  Chemistries  Recent Labs  Lab 2016-03-24 1237  01/07/17 0822 01/08/17 0403  NA 146*   < > 144 140  K 4.8   < > 4.7 5.7*  CL 111   < > 115* 108  CO2 29   < > 26 27  GLUCOSE 113*   < > 92 154*  BUN 35*   < > 38* 51*  CREATININE 2.21*   < > 2.40* 2.89*  CALCIUM 8.4*   < > 7.1* 7.8*  MG  --   --  2.2  --   AST 43*  --   --   --   ALT 25  --   --   --   ALKPHOS 191*  --   --   --   BILITOT 1.0  --   --   --    < > = values in this interval not displayed.   ------------------------------------------------------------------------------------------------------------------  Cardiac Enzymes Recent Labs  Lab 2016-03-24 1237  TROPONINI 0.05*   ------------------------------------------------------------------------------------------------------------------  RADIOLOGY:  [place] Renal  Result Date: 01/06/2017 CLINICAL DATA:  Acute renal failure. EXAM: RENAL / URINARY TRACT ULTRASOUND COMPLETE COMPARISON:  None. FINDINGS: Right Kidney: Length: 11.1 cm.  Contains a 1.8 x 1.3 x 1.8 cm cysts. Left Kidney: Length: 11.2 cm. Echogenicity within normal limits. No mass or hydronephrosis visualized. Bladder: The bladder is decompressed with a Foley catheter limiting evaluation. IMPRESSION: No cause for acute renal failure identified. Electronically Signed   By: Gerome Samavid  Williams III M.D   On: 01/06/2017 16:28    ASSESSMENT AND PLAN:   Active Problems:   Large pleural effusion  83 year old male with a known history  of coronary artery disease status post CABG, dementia, hypertension is being admitted for hypoxia there is a large left pleural effusion, ascites and acute renal failure  *Acute hypoxic respiratory failure secondary to large left-sided pleural effusion/component of COPD Status post thoracentesis Nebulizer treatments Wean off oxygen as tolerated, still on 5-6 L of  oxygen -IR guided thoracentesis done, 1.1 ltr fluid removed. -fluid studies ordered, waiting cx report. Repeat chest x-ray with cardiomegaly, left pleural effusion with basilar opacity and vascular congestion  *Acute on chronic kidney disease stage III -Monitor with gentle hydration -Avoid nephrotoxic medication -Creatinine at 2.21, 2.42-2.41--2.40 -2.89 base line at 1.35 -follow with  nephrology, discontinue IV fluids, Foley catheter to monitor urine output Foley catheter placed for urinary retention,  *Hyperkalemia Kayexalate and recheck  *Sinus bradycardia Patient is asymptomatic.  Heart rate is dropping down to 39 Not on any rate limiting drugs.  Cardiology consult placed to [facility]  * Mixed Dementia, Vascular Dementia and Alzheimer's Disease:  -Continue Aricept, with Dr. Sherryll BurgerShah neurology  * History of stroke -resume Plavix and Lipitor which was held for thoracentesis -Lipitor may not be ideal with him having recurrent falls  - physical and occupational therapy evaluation.  Recommending skilled nursing facility    All the records are reviewed and case discussed with Care Management/Social Workerr. Management plans discussed with the patient, stepdaughter over phone and wife at bedside and they are in agreement.  CODE STATUS: DNR  TOTAL TIME TAKING CARE OF THIS PATIENT: 35 minutes.     POSSIBLE D/C IN 1-2 DAYS, DEPENDING ON CLINICAL CONDITION.   Ramonita LabGouru, Ettie Krontz M.D on 01/08/2017   Between 7am to 6pm - Pager - [phone]   After 6pm go to [email or web address] - password [email or web address]  [facility] [facility]  Office  [phone]  CC: Primary care physician; Corky DownsMasoud, Javed, MD  Note: This dictation was prepared with Dragon dictation along with smaller phrase technology. Any transcriptional errors that result from this process are unintentional.

## 2017-01-09 ENCOUNTER — Inpatient Hospital Stay: Payer: Medicare HMO

## 2017-01-09 DIAGNOSIS — F0281 Dementia in other diseases classified elsewhere with behavioral disturbance: Secondary | ICD-10-CM

## 2017-01-09 DIAGNOSIS — R0902 Hypoxemia: Secondary | ICD-10-CM

## 2017-01-09 DIAGNOSIS — Z7189 Other specified counseling: Secondary | ICD-10-CM

## 2017-01-09 DIAGNOSIS — R0600 Dyspnea, unspecified: Secondary | ICD-10-CM

## 2017-01-09 DIAGNOSIS — N179 Acute kidney failure, unspecified: Secondary | ICD-10-CM

## 2017-01-09 DIAGNOSIS — J90 Pleural effusion, not elsewhere classified: Principal | ICD-10-CM

## 2017-01-09 DIAGNOSIS — W19XXXA Unspecified fall, initial encounter: Secondary | ICD-10-CM

## 2017-01-09 DIAGNOSIS — G309 Alzheimer's disease, unspecified: Secondary | ICD-10-CM

## 2017-01-09 DIAGNOSIS — I483 Typical atrial flutter: Secondary | ICD-10-CM

## 2017-01-09 DIAGNOSIS — Z515 Encounter for palliative care: Secondary | ICD-10-CM

## 2017-01-09 DIAGNOSIS — N189 Chronic kidney disease, unspecified: Secondary | ICD-10-CM

## 2017-01-09 DIAGNOSIS — I272 Pulmonary hypertension, unspecified: Secondary | ICD-10-CM

## 2017-01-09 DIAGNOSIS — R601 Generalized edema: Secondary | ICD-10-CM

## 2017-01-09 DIAGNOSIS — R0602 Shortness of breath: Secondary | ICD-10-CM

## 2017-01-09 DIAGNOSIS — J432 Centrilobular emphysema: Secondary | ICD-10-CM

## 2017-01-09 DIAGNOSIS — R001 Bradycardia, unspecified: Secondary | ICD-10-CM

## 2017-01-09 LAB — PROCALCITONIN: Procalcitonin: <0.1 ng/mL

## 2017-01-09 LAB — URIC ACID: URIC ACID, SERUM: 8.9 mg/dL — AB (ref 4.4–7.6)

## 2017-01-09 LAB — RENAL FUNCTION PANEL
ALBUMIN: 3.1 g/dL — AB (ref 3.5–5.0)
ANION GAP: 8 (ref 5–15)
BUN: 54 mg/dL — ABNORMAL HIGH (ref 6–20)
CALCIUM: 8 mg/dL — AB (ref 8.9–10.3)
CO2: 26 mmol/L (ref 22–32)
Chloride: 105 mmol/L (ref 101–111)
Creatinine, Ser: 2.93 mg/dL — ABNORMAL HIGH (ref 0.61–1.24)
GFR, EST AFRICAN AMERICAN: 21 mL/min — AB (ref >60)
GFR, EST NON AFRICAN AMERICAN: 18 mL/min — AB (ref >60)
GLUCOSE: 127 mg/dL — AB (ref 65–99)
PHOSPHORUS: 5.5 mg/dL — AB (ref 2.5–4.6)
POTASSIUM: 4.9 mmol/L (ref 3.5–5.1)
SODIUM: 139 mmol/L (ref 135–145)

## 2017-01-09 LAB — BLOOD GAS, ARTERIAL
ACID-BASE DEFICIT: 1.3 mmol/L (ref 0.0–2.0)
BICARBONATE: 28.3 mmol/L — AB (ref 20.0–28.0)
FIO2: 0.44
O2 SAT: 89 %
PH ART: 7.19 — AB (ref 7.350–7.450)
Patient temperature: 37
pCO2 arterial: 74 mmHg (ref 32.0–48.0)
pO2, Arterial: 70 mmHg — ABNORMAL LOW (ref 83.0–108.0)

## 2017-01-09 LAB — CBC
HEMATOCRIT: 36.8 % — AB (ref 40.0–52.0)
Hemoglobin: 11.6 g/dL — ABNORMAL LOW (ref 13.0–18.0)
MCH: 28.5 pg (ref 26.0–34.0)
MCHC: 31.6 g/dL — AB (ref 32.0–36.0)
MCV: 90 fL (ref 80.0–100.0)
PLATELETS: 307 10*3/uL (ref 150–440)
RBC: 4.09 MIL/uL — ABNORMAL LOW (ref 4.40–5.90)
RDW: 18.6 % — AB (ref 11.5–14.5)
WBC: 8.4 10*3/uL (ref 3.8–10.6)

## 2017-01-09 LAB — CHOLESTEROL, BODY FLUID: Cholesterol, Fluid: 16 mg/dL

## 2017-01-09 LAB — GLUCOSE, CAPILLARY: GLUCOSE-CAPILLARY: 129 mg/dL — AB (ref 65–99)

## 2017-01-09 MED ORDER — POLYVINYL ALCOHOL 1.4 % OP SOLN
1.0000 [drp] | Freq: Four times a day (QID) | OPHTHALMIC | Status: DC | PRN
  Filled 2017-01-09: qty 15

## 2017-01-09 MED ORDER — HALOPERIDOL 0.5 MG PO TABS: 0.5000 mg | ORAL_TABLET | ORAL | Status: DC | PRN

## 2017-01-09 MED ORDER — ONDANSETRON HCL 4 MG/2ML IJ SOLN: 4.0000 mg | Freq: Four times a day (QID) | INTRAMUSCULAR | Status: DC | PRN

## 2017-01-09 MED ORDER — FUROSEMIDE 10 MG/ML IJ SOLN
8.0000 mg/h | INTRAVENOUS | Status: DC
  Filled 2017-01-09: qty 25

## 2017-01-09 MED ORDER — ATORVASTATIN CALCIUM 20 MG PO TABS: 40.0000 mg | ORAL_TABLET | Freq: Every day | ORAL | Status: DC

## 2017-01-09 MED ORDER — GLYCOPYRROLATE 1 MG PO TABS
1.0000 mg | ORAL_TABLET | ORAL | Status: DC | PRN
  Filled 2017-01-09: qty 1

## 2017-01-09 MED ORDER — HEPARIN SODIUM (PORCINE) 5000 UNIT/ML IJ SOLN
5000.0000 [IU] | Freq: Three times a day (TID) | INTRAMUSCULAR | Status: DC
  Administered 2017-01-09: 5000 [IU] via SUBCUTANEOUS
  Filled 2017-01-09: qty 1

## 2017-01-09 MED ORDER — SODIUM CHLORIDE 0.9% FLUSH
3.0000 mL | Freq: Two times a day (BID) | INTRAVENOUS | Status: DC
  Administered 2017-01-10: 3 mL via INTRAVENOUS

## 2017-01-09 MED ORDER — ACETAMINOPHEN 325 MG PO TABS: 650.0000 mg | ORAL_TABLET | Freq: Four times a day (QID) | ORAL | Status: DC | PRN

## 2017-01-09 MED ORDER — SODIUM CHLORIDE 0.9% FLUSH: 3.0000 mL | INTRAVENOUS | Status: DC | PRN

## 2017-01-09 MED ORDER — GLYCOPYRROLATE 0.2 MG/ML IJ SOLN
0.2000 mg | INTRAMUSCULAR | Status: DC | PRN
  Administered 2017-01-11: 0.2 mg via INTRAVENOUS
  Filled 2017-01-09 (×2): qty 1

## 2017-01-09 MED ORDER — ACETAMINOPHEN 650 MG RE SUPP: 650.0000 mg | Freq: Four times a day (QID) | RECTAL | Status: DC | PRN

## 2017-01-09 MED ORDER — MORPHINE SULFATE (PF) 2 MG/ML IV SOLN
1.0000 mg | INTRAVENOUS | Status: DC | PRN
  Administered 2017-01-09 – 2017-01-10 (×4): 1 mg via INTRAVENOUS
  Filled 2017-01-09 (×5): qty 1

## 2017-01-09 MED ORDER — MORPHINE SULFATE (PF) 2 MG/ML IV SOLN: 1.0000 mg | INTRAVENOUS | Status: DC | PRN

## 2017-01-09 MED ORDER — CLOPIDOGREL BISULFATE 75 MG PO TABS: 75.0000 mg | ORAL_TABLET | Freq: Every day | ORAL | Status: DC

## 2017-01-09 MED ORDER — HALOPERIDOL LACTATE 2 MG/ML PO CONC
0.5000 mg | ORAL | Status: DC | PRN
  Filled 2017-01-09: qty 0.3

## 2017-01-09 MED ORDER — DIPHENHYDRAMINE HCL 50 MG/ML IJ SOLN
12.5000 mg | Freq: Once | INTRAMUSCULAR | Status: AC
  Administered 2017-01-09: 05:00:00 12.5 mg via INTRAVENOUS
  Filled 2017-01-09 (×2): qty 0.25

## 2017-01-09 MED ORDER — ONDANSETRON 4 MG PO TBDP: 4.0000 mg | ORAL_TABLET | Freq: Four times a day (QID) | ORAL | Status: DC | PRN

## 2017-01-09 MED ORDER — METHYLPREDNISOLONE SODIUM SUCC 40 MG IJ SOLR: 40.0000 mg | Freq: Two times a day (BID) | INTRAMUSCULAR | Status: DC

## 2017-01-09 MED ORDER — BIOTENE DRY MOUTH MT LIQD: 15.0000 mL | OROMUCOSAL | Status: DC | PRN

## 2017-01-09 MED ORDER — GLYCOPYRROLATE 0.2 MG/ML IJ SOLN: 0.2000 mg | INTRAMUSCULAR | Status: DC | PRN

## 2017-01-09 MED ORDER — SODIUM CHLORIDE 0.9 % IV SOLN: 250.0000 mL | INTRAVENOUS | Status: DC | PRN

## 2017-01-09 MED ORDER — LORAZEPAM 2 MG/ML IJ SOLN
1.0000 mg | INTRAMUSCULAR | Status: DC | PRN
  Administered 2017-01-09 – 2017-01-11 (×3): 1 mg via INTRAVENOUS
  Filled 2017-01-09 (×3): qty 1

## 2017-01-09 MED ORDER — HALOPERIDOL LACTATE 5 MG/ML IJ SOLN
0.5000 mg | INTRAMUSCULAR | Status: DC | PRN
  Administered 2017-01-09 – 2017-01-10 (×2): 0.5 mg via INTRAVENOUS
  Filled 2017-01-09 (×2): qty 1

## 2017-01-09 NOTE — Progress Notes (Signed)
Physical Therapy Treatment Patient Details Name: Ryan BurdockRufus L Villanueva MRN: [id number] DOB: 05/23/1930 Today's Date: 01/09/2017    History of Present Illness Pt is an 83 y.o. male presenting to hospital with 3 falls in 1 week, increased LE swelling, and hypoxia.  Pt admitted with large L pleural effusion, ascites, acute renal failure with urinary retention.  S/p thoracentesis (1.1 L).  PMH includes CAD, stroke, a-flutter, Alzheimer's dementia, htn, CABG, and h/o bradycardia.    PT Comments    Pt agreeable to PT; family in the room. Denies pain. Pt participates in supine bed exercises with assist as needed; following single step commands well. Fatigues quickly, but able to continue well post short rest. Initiating supine to sit edge of bed when respiratory therapist entered noting pt's lab work has just come in and MD was placing transfer orders to ICU for placement on BiPap. Treatment ceased at that time. Pt will need new orders or continuation of PT orders for treatment. Continue as appropriate.   Follow Up Recommendations  SNF     Equipment Recommendations  Rolling walker with 5" wheels    Recommendations for Other Services       Precautions / Restrictions Precautions Precautions: Fall Restrictions Weight Bearing Restrictions: No    Mobility  Bed Mobility Overal bed mobility: Needs Assistance             General bed mobility comments: Just beginning to initiate supine to sit when respiratory therapist entered room stating pt's lab work had just come in and MD will be in to see pt/family and is placing order to transfer pt to ICU for placement on BiPap  Transfers                    Ambulation/Gait                 Stairs            Wheelchair Mobility    Modified Rankin (Stroke Patients Only)       Balance                                            Cognition Arousal/Alertness: Awake/alert(but fatigues/ becomes lethargic several  times) Behavior During Therapy: WFL for tasks assessed/performed Overall Cognitive Status: History of cognitive impairments - at baseline                                        Exercises General Exercises - Lower Extremity Ankle Circles/Pumps: AROM;Both;20 reps;Supine Quad Sets: Strengthening;Both;10 reps;Supine Gluteal Sets: Strengthening;Both;10 reps;Supine Short Arc Quad: AROM;AAROM;Both;15 reps;Supine Heel Slides: AAROM;Both;15 reps;Supine Hip ABduction/ADduction: AAROM;AROM;Both;15 reps;Supine(assist only to hold leg off sheets) Straight Leg Raises: Strengthening;Both;10 reps;Supine Other Exercises Other Exercises: supine scoot to edge of bed    General Comments        Pertinent Vitals/Pain Pain Assessment: No/denies pain    Home Living                      Prior Function            PT Goals (current goals can now be found in the care plan section) Progress towards PT goals: PT to reassess next treatment    Frequency    Min 2X/week  PT Plan Current plan remains appropriate    Co-evaluation              AM-PAC PT "6 Clicks" Daily Activity  Outcome Measure  Difficulty turning over in bed (including adjusting bedclothes, sheets and blankets)?: Unable Difficulty moving from lying on back to sitting on the side of the bed? : Unable Difficulty sitting down on and standing up from a chair with arms (e.g., wheelchair, bedside commode, etc,.)?: Unable Help needed moving to and from a bed to chair (including a wheelchair)?: Total Help needed walking in hospital room?: Total Help needed climbing 3-5 steps with a railing? : Total 6 Click Score: 6    End of Session Equipment Utilized During Treatment: Oxygen Activity Tolerance: Patient limited by fatigue;Other (comment)(change in status; being transferred to ICU) Patient left: in bed;with call bell/phone within reach;with nursing/sitter in room;with family/visitor present   PT  Visit Diagnosis: Other abnormalities of gait and mobility (R26.89);Repeated falls (R29.6);Muscle weakness (generalized) (M62.81)     Time: [id number] PT Time Calculation (min) (ACUTE ONLY): 25 min  Charges:  $Therapeutic Exercise: 23-37 mins                    G Codes:  Functional Assessment Tool Used: AM-PAC 6 Clicks Basic Mobility     Scot DockHeidi E Jamee Pacholski, PTA 01/09/2017, 9:55 AM

## 2017-01-09 NOTE — Consult Note (Signed)
Consultation Note Date: 01/09/2017   Patient Name: Ryan Villanueva  DOB: Mar 13, 1930  MRN: [id number]  Age / Sex: 83 y.o., male  PCP: Cletis Athens, MD Referring Physician: Nicholes Mango, MD  Reason for Consultation: Establishing goals of care and Terminal Care  HPI/Patient Profile: Ryan Villanueva  is a 83 y.o. male with a known history of CAD and stroke who  presented to the emergency department today with falls x3 this week. The family wAs especially concerned because they have noticed increased swelling on his abdomen as well as bilateral upper extremities. Patient also found to be hypoxic to 86% on room air in triage and does not wear oxygen at home. Patient also noted to be bradycardic but has history of bradycardia per family.  Chest x-ray shows large left pleural effusion.  Is a difficult historian due to his dementia.     Clinical Assessment and Goals of Care: Met with family at bedside. Patient's wife and two daughters present. Wife and daughter state he has advanced dementia and has been declining. She states he has always been an independent man and never complained. 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   Per his wife, he was eating "anything you put in front of him" 3 weeks ago, but as of 1 week ago, he has been eating 1 meal per day at best, and has had several falls. They state he is sleeping more this week than he had been.  His wife states he would not want dialysis or a feeding tube. She states they have been told to consider how many more times they would want him to have a thoracentesis, as he had one 11/2 and is having one today, and how much longer they would want BiPAP in place. They state he would not want any more thoracentesis procedures, and continuing the BiPAP won't change anything. They are apprehensive about discontinuing BiPAP tonight  and would like to wait until tomorrow to make the definitive decision. However,  ICU bed just now available, family does not want to move to ICU, and is ready to move to comfort measures requesting to keep BiPAP just long enough for family to arrive in the next few hours. Request communicated to Charge RN and Dr. Margaretmary Eddy.     Wife is Media planner.   MOST form completed for comfort measures.     SUMMARY OF RECOMMENDATIONS   Anticipated hospital death upon removing BiPAP.   Code Status/Advance Care Planning:  DNR    Symptom Management:   Rubinol for excessive secretions  Haldol for agitation  Ativan for anxiety  Morphine for pain  Zofran for nausea.   Additional Recommendations (Limitations, Scope, Preferences):  Full Comfort Care  Prognosis:   Hours - Days Patient has been using BiPAP today with initial plans for escalation to ICU level care.   Discharge Planning: Anticipated Hospital Death     Primary Diagnoses: Present on Admission: . Large pleural effusion   I  have reviewed the medical record, interviewed the patient and family, and examined the patient. The following aspects are pertinent.  Past Medical History:  Diagnosis Date  . Abnormal prostate specific antigen 03/30/2008   Normal prostate biopsy Dr. Darlys Gales 02/16/2013. Chronic and acutely inflamed prostate tissue.    . Atrial flutter ([place])   . CAD in native artery 03/01/2008   h/o 3V CABG 1990 at [facility]  . Cerebral infarct ([place]) 10/07/2013   dysarthria, arm weakness  . Early onset Alzheimer's dementia   . Hypertension    Social History   Socioeconomic History  . Marital status: Married    Spouse name: None  . Number of children: None  . Years of education: None  . Highest education level: None  Social Needs  . Financial resource strain: None  . Food insecurity - worry: None  . Food insecurity - inability: None  . Transportation needs - medical: None  . Transportation needs - non-medical:  None  Occupational History  . None  Tobacco Use  . Smoking status: Former Smoker    Years: 30.00    Types: Cigarettes  . Smokeless tobacco: Never Used  . Tobacco comment: quit smoking in the 1990's uses smokeless tobacco  Substance and Sexual Activity  . Alcohol use: No  . Drug use: No  . Sexual activity: None  Other Topics Concern  . None  Social History Narrative  . None   No family history on file. Scheduled Meds: . amLODipine  10 mg Oral Daily  . atorvastatin  40 mg Oral q1800  . clopidogrel  75 mg Oral Daily  . docusate sodium  100 mg Oral BID  . heparin subcutaneous  5,000 Units Subcutaneous Q8H  . ipratropium-albuterol  3 mL Nebulization TID  . lamoTRIgine  25 mg Oral BID  . memantine  5 mg Oral Daily  . methylPREDNISolone (SOLU-MEDROL) injection  40 mg Intravenous Q12H  . tamsulosin  0.8 mg Oral QPC supper   Continuous Infusions: . furosemide (LASIX) infusion     PRN Meds:.acetaminophen **OR** acetaminophen, albuterol, bisacodyl, LORazepam, ondansetron **OR** ondansetron (ZOFRAN) IV Medications Prior to Admission:  Prior to Admission medications   Medication Sig Start Date End Date Taking? Authorizing Provider  amLODipine (NORVASC) 10 MG tablet TAKE 1 TABLET EVERY DAY 11/27/14  Yes Birdie Sons, MD  atorvastatin (LIPITOR) 40 MG tablet Take 1 tablet (40 mg total) by mouth daily. 12/15/14  Yes Birdie Sons, MD  clopidogrel (PLAVIX) 75 MG tablet Take 75 mg by mouth daily.   Yes [provider]  lamoTRIgine (LAMICTAL) 25 MG tablet Take 25 mg 2 (two) times daily by mouth.    Yes [provider]  memantine (NAMENDA) 5 MG tablet Take 5 mg daily by mouth.   Yes [provider]  MULTIPLE VITAMIN PO Take 1 tablet by mouth daily. 03/01/08  Yes [provider]  acetaminophen (TYLENOL) 325 MG tablet Take 650 mg by mouth every 6 (six) hours as needed.    [provider]  Rivaroxaban (XARELTO) 15 MG TABS tablet Take 1 tablet  (15 mg total) by mouth daily with supper. Patient not taking: Reported on 07/30/2016 08/17/15   Wellington Hampshire, MD   No Known Allergies Review of Systems  Unable to perform ROS   Physical Exam  Constitutional:  Resting in bed on BiPAP. Eyes closed.   HENT:  Head: Normocephalic.  Cardiovascular:  Warm and dry  Pulmonary/Chest: Effort normal.  BiPAP    Vital Signs:  BP 139/87   Pulse 62   Temp 97.8 F (36.6 C)   Resp 20   Ht _0  (1.702 m)   Wt 80.3 kg (177 lb)   SpO2 95%   BMI 27.72 kg/m  Pain Assessment: No/denies pain   Pain Score: 0-No pain   SpO2: SpO2: 95 % O2 Device:SpO2: 95 % O2 Flow Rate: .O2 Flow Rate (L/min): 40 L/min  IO: Intake/output summary:   Intake/Output Summary (Last 24 hours) at 01/09/2017 1448 Last data filed at 01/09/2017 1002 Gross per 24 hour  Intake 360 ml  Output 50 ml  Net 310 ml    LBM: Last BM Date: 01/08/17 Baseline Weight: Weight: 82.1 kg (181 lb) Most recent weight: Weight: 80.3 kg (177 lb)     Palliative Assessment/Data: 20%     Time In: 2:00 Time Out: 4:00 Time Total: 2 hours Greater than 50%  of this time was spent counseling and coordinating care related to the above assessment and plan.  Signed by: Asencion Gowda, NP 01/09/2017 3:56 PM Office: [phone] 7am-7pm  Pager: [phone] Call primary team after hours   Please contact Palliative Medicine Team phone at [phone] for questions and concerns.  For individual provider: See Shea Evans

## 2017-01-09 NOTE — Consult Note (Signed)
Name: Ryan BurdockRufus L Hegg MRN: [id number] DOB: 02/25/1931    ADMISSION DATE:  01/07/2017 CONSULTATION DATE: 01/09/2017  REFERRING MD : Dr. Amado CoeGouru   CHIEF COMPLAINT: Shortness of Breath   BRIEF PATIENT DESCRIPTION: 83 yo male admitted 11/2 with c/o falls, lower extremity edema, and acute hypoxic respiratory failure secondary to large left pleural effusion s/p left thoracentesis with removal of 1.1L of clear pleural fluid on 11/2.  PCCM consulted 11/7 due to pt developing acute hypoxic hypercapnic respiratory failure secondary to CHF, reoccurring left sided pleural effusion, and likely AECOPD although not officially diagnosed, however pt has a 40 yr smoking hx.  SIGNIFICANT EVENTS  11/2-Pt admitted to the medsurg unit   STUDIES:  CT Head 11/2>>No acute abnormality. Atrophy and chronic microvascular ischemic change. Extensive atherosclerosis. US Abdomen Limited 11/2>>Overall small volume of ascites in the peritoneal cavity US Thoracentesis 11/2>>Successful ultrasound guided left thoracentesis yielding 1.1 L of pleural fluid US Renal 11/4>>No cause for acute renal failure identified. Echo 11/4>>EF 50%  PAST MEDICAL HISTORY :   has a past medical history of Abnormal prostate specific antigen (03/30/2008), Atrial flutter ([facility]), CAD in native artery (03/01/2008), Cerebral infarct ([facility]) (10/07/2013), Early onset Alzheimer's dementia, and Hypertension.  has a past surgical history that includes History of coronary artery bypass graft (1990) and Coronary artery bypass graft. Prior to Admission medications   Medication Sig Start Date End Date Taking? Authorizing Provider  amLODipine (NORVASC) 10 MG tablet TAKE 1 TABLET EVERY DAY 11/27/14  Yes Malva LimesFisher, Donald E, MD  atorvastatin (LIPITOR) 40 MG tablet Take 1 tablet (40 mg total) by mouth daily. 12/15/14  Yes Malva LimesFisher, Donald E, MD  clopidogrel (PLAVIX) 75 MG tablet Take 75 mg by mouth daily.   Yes [provider]  lamoTRIgine (LAMICTAL) 25 MG tablet  Take 25 mg 2 (two) times daily by mouth.    Yes [provider]  memantine (NAMENDA) 5 MG tablet Take 5 mg daily by mouth.   Yes [provider]  MULTIPLE VITAMIN PO Take 1 tablet by mouth daily. 03/01/08  Yes [provider]  acetaminophen (TYLENOL) 325 MG tablet Take 650 mg by mouth every 6 (six) hours as needed.    [provider]  Rivaroxaban (XARELTO) 15 MG TABS tablet Take 1 tablet (15 mg total) by mouth daily with supper. Patient not taking: Reported on 07/30/2016 08/17/15   Iran OuchArida, Muhammad A, MD   No Known Allergies  FAMILY HISTORY:  family history is not on file. SOCIAL HISTORY:  reports that he has quit smoking. His smoking use included cigarettes. He quit after 30.00 years of use. he has never used smokeless tobacco. He reports that he does not drink alcohol or use drugs.  REVIEW OF SYSTEMS:   Unable to assess pt has dementia   SUBJECTIVE:  Unable to assess pt has dementia   VITAL SIGNS: Temp:  [97.6 F (36.4 C)-97.8 F (36.6 C)] 97.8 F (36.6 C) (11/07 0423) Pulse Rate:  [43-79] 62 (11/07 0920) Resp:  [18-20] 20 (11/07 0423) BP: (139-160)/(58-87) 139/87 (11/07 0800) SpO2:  [87 %-98 %] 95 % (11/07 0938) FiO2 (%):  [70 %] 70 % (11/07 0938) Weight:  [80.3 kg (177 lb)] 80.3 kg (177 lb) (11/07 0423)  PHYSICAL EXAMINATION: General: elderly male on Bipap, NAD  Neuro: confused, not following commands, withdraws from painful stimulation  HEENT: supple, no JVD  Cardiovascular: sinus bradycardia, no M/R/G Lungs: faint expiratory wheezes throughout and diminished left bases, even, non labored  Abdomen: +BS x4,  soft, non distended, non tender Musculoskeletal: 1+ bilateral lower extremity edema, moves all extremities  Skin: scattered ecchymosis   Recent Labs  Lab 01/07/17 0822 01/08/17 0403 01/09/17 0625  NA 144 140 139  K 4.7 5.7* 4.9  CL 115* 108 105  CO2 26 27 26   BUN 38* 51* 54*  CREATININE 2.40* 2.89* 2.93*  GLUCOSE 92 154*  127*   Recent Labs  Lab 03-16-16 1237 01/05/17 0428 01/09/17 0626  HGB 12.0* 11.7* 11.6*  HCT 36.7* 36.3* 36.8*  WBC 7.0 6.2 8.4  PLT 277 236 307   Dg Chest Port 1 View  Result Date: 01/09/2017 CLINICAL DATA:  Shortness of breath. Started this morning. History of atrial flutter. EXAM: PORTABLE CHEST 1 VIEW COMPARISON:  01/06/2017 FINDINGS: Bilateral diffuse interstitial thickening. Trace right pleural effusion. Moderate -large left pleural effusion. No pneumothorax. Stable cardiomegaly. Prior CABG. No acute osseous abnormality. IMPRESSION: Findings most concerning for CHF. Electronically Signed   By: Elige KoHetal  Patel   On: 01/09/2017 09:56    ASSESSMENT / PLAN: Acute hypoxic hypercapnic respiratory failure secondary to pulmonary edema, possible AECOPD, and  reocurring left pleural effusion (s/p left thoracentesis 11/2 with removal of 1.1L pleural fluid)  Acute encephalopathy secondary to hypercapnia  Hx: Former Smoker and Severe Dementia P: Continue Bipap for now wean as tolerated Continue IV lasix  Repeat CXR in am  Added iv steroids wean as tolerated Continue scheduled and prn bronchodilator therapy  Recommend IR consultation for left sided thoracentesis  Agree with palliative care consult  Sonda Rumbleana Blakeney, AGNP  Pulmonary/Critical Care Pager [phone] (please enter 7 digits) PCCM Consult Pager [phone] (please enter 7 digits)

## 2017-01-09 NOTE — Progress Notes (Signed)
Spoke with Sonda Rumbleana Blakeney, NP. She stated patient could be transferred to ICU. We will transfer when a bed is available

## 2017-01-09 NOTE — Progress Notes (Signed)
[facility]  ROUNDING NOTE   Subjective:   Worsening shortness of breath. Placed on Bipap. To be moved to ICU.   K 4.9 Creatinine 2.93 (9.89)  UOP 50   Objective:  Vital signs in last 24 hours:  Temp:  [97.6 F (36.4 C)-97.8 F (36.6 C)] 97.8 F (36.6 C) (11/07 0423) Pulse Rate:  [43-79] 62 (11/07 0920) Resp:  [18-20] 20 (11/07 0423) BP: (139-160)/(58-87) 139/87 (11/07 0800) SpO2:  [87 %-98 %] 95 % (11/07 0938) FiO2 (%):  [70 %] 70 % (11/07 0938) Weight:  [80.3 kg (177 lb)] 80.3 kg (177 lb) (11/07 0423)  Weight change: -5.443 kg () Filed Weights   01/06/17 0427 01/08/17 0435 01/09/17 0423  Weight: 83.5 kg (184 lb) 85.7 kg (189 lb) 80.3 kg (177 lb)    Intake/Output: I/O last 3 completed shifts: In: 480 [P.O.:480] Out: 250 [Urine:250]   Intake/Output this shift:  Total I/O In: 360 [P.O.:360] Out: -   Physical Exam: General: No acute distress, laying in bed  Head: BIPAP  Eyes: Anicteric  Neck: Supple, trachea midline  Lungs:  Left greater than right crackles  Heart: S1S2 no rubs  Abdomen:  Soft, nontender, bowel sounds present  Extremities: trace peripheral edema.  Neurologic: Awake, but confused  Skin: No lesions       Basic Metabolic Panel: Recent Labs  Lab 01/05/17 0428 01/06/17 0444 01/07/17 0822 01/08/17 0403 01/09/17 0625  NA 146* 142 144 140 139  K 4.6 5.0 4.7 5.7* 4.9  CL 111 110 115* 108 105  CO2 28 24 26 27 26   GLUCOSE 94 96 92 154* 127*  BUN 35* 34* 38* 51* 54*  CREATININE 2.42* 2.41* 2.40* 2.89* 2.93*  CALCIUM 8.1* 8.0* 7.1* 7.8* 8.0*  MG  --   --  2.2  --   --   PHOS  --   --   --   --  5.5*    Liver Function Tests: Recent Labs  Lab November 27, 2016 1237 01/09/17 0625  AST 43*  --   ALT 25  --   ALKPHOS 191*  --   BILITOT 1.0  --   PROT 6.4*  --   ALBUMIN 3.3* 3.1*   No results for input(s): LIPASE, AMYLASE in the last 168 hours. No results for input(s): AMMONIA in the last 168 hours.  CBC: Recent Labs  Lab  November 27, 2016 1237 01/05/17 0428 01/09/17 0626  WBC 7.0 6.2 8.4  NEUTROABS 5.5  --   --   HGB 12.0* 11.7* 11.6*  HCT 36.7* 36.3* 36.8*  MCV 87.6 87.9 90.0  PLT 277 236 307    Cardiac Enzymes: Recent Labs  Lab November 27, 2016 1237  TROPONINI 0.05*    BNP: Invalid input(s): POCBNP  CBG: Recent Labs  Lab 01/05/17 0743 01/09/17 0754  GLUCAP 80 129*    Microbiology: Results for orders placed or performed during the hospital encounter of November 27, 2016  Body fluid culture     Status: None   Collection Time: November 27, 2016  3:40 PM  Result Value Ref Range Status   Specimen Description PLEURAL  Final   Special Requests NONE  Final   Gram Stain   Final    CYTOSPIN SMEAR WBC PRESENT, PREDOMINANTLY MONONUCLEAR NO ORGANISMS SEEN    Culture   Final    NO GROWTH 3 DAYS Performed at [facility], [address] [place], [place] [address]    Report Status 01/08/2017 FINAL  Final  Fungus Culture With Stain  Status: None (Preliminary result)   Collection Time: 01/10/2017  3:40 PM  Result Value Ref Range Status   Fungus Stain Final report  Final    Comment: (NOTE) Performed At: [facility] [address] [place], [place] [id number] Jolene SchimkeNagendra Sanjai MD WU:[id number]:[phone]    Fungus (Mycology) Culture PENDING  Incomplete   Fungal Source PLEURAL  Final  Acid Fast Smear (AFB)     Status: None   Collection Time: 01/25/2017  3:40 PM  Result Value Ref Range Status   AFB Specimen Processing Concentration  Final   Acid Fast Smear Negative  Final    Comment: (NOTE) Performed At: [facility] [address] [place], [place] [id number] Jolene SchimkeNagendra Sanjai MD VH:[id number]:[phone]    Source (AFB) PLEURAL  Final  Fungus Culture Result     Status: None   Collection Time: 01/29/2017  3:40 PM  Result Value Ref Range Status   Result 1 Comment  Final    Comment: (NOTE) KOH/Calcofluor preparation:  no fungus observed. Performed At: [facility] [address] [place], [place] [id number] Jolene SchimkeNagendra  Sanjai MD UV:[id number]:[phone]     Coagulation Studies: No results for input(s): LABPROT, INR in the last 72 hours.  Urinalysis: No results for input(s): COLORURINE, LABSPEC, PHURINE, GLUCOSEU, HGBUR, BILIRUBINUR, KETONESUR, PROTEINUR, UROBILINOGEN, NITRITE, LEUKOCYTESUR in the last 72 hours.  Invalid input(s): APPERANCEUR    Imaging: Dg Chest Port 1 View  Result Date: 01/09/2017 CLINICAL DATA:  Shortness of breath. Started this morning. History of atrial flutter. EXAM: PORTABLE CHEST 1 VIEW COMPARISON:  01/06/2017 FINDINGS: Bilateral diffuse interstitial thickening. Trace right pleural effusion. Moderate -large left pleural effusion. No pneumothorax. Stable cardiomegaly. Prior CABG. No acute osseous abnormality. IMPRESSION: Findings most concerning for CHF. Electronically Signed   By: Elige KoHetal  Patel   On: 01/09/2017 09:56     Medications:   . furosemide (LASIX) infusion     . amLODipine  10 mg Oral Daily  . atorvastatin  40 mg Oral q1800  . clopidogrel  75 mg Oral Daily  . docusate sodium  100 mg Oral BID  . heparin subcutaneous  5,000 Units Subcutaneous Q8H  . ipratropium-albuterol  3 mL Nebulization TID  . lamoTRIgine  25 mg Oral BID  . memantine  5 mg Oral Daily  . methylPREDNISolone (SOLU-MEDROL) injection  40 mg Intravenous Q12H  . tamsulosin  0.8 mg Oral QPC supper   acetaminophen **OR** acetaminophen, albuterol, bisacodyl, LORazepam, ondansetron **OR** ondansetron (ZOFRAN) IV  Assessment/ Plan:   Ryan Villanueva is a 83 year old white male with hypertension, coronary artery disease with CABG, dementia, BPH  1.  Acute renal failure with hyperkalemia on chronic kidney disease stage III:  Acute renal failure secondary to acute cardiorenal syndrome and obstructive uropathy.  Urine output improved with foley catheter placement - Start furosemide gtt.   - Discussed dialysis with wife. Patient stated in the past that he would not want dialysis.  - Monitor urine output and  renal function   2.  Hypertension:  Blood pressure at goal but with peripheral edema/anasarca Echocardiogram with diastolic dysfunction Now with respiratory failure from congestive heart failure requiring noninvasive mechanical ventilation.  - tamsulosin and amlodipine.  - Furosemide gtt as above.    LOS: 5 Ryan Villanueva 11/7/20182:02 PM

## 2017-01-09 NOTE — Consult Note (Signed)
Cardiology Consultation:   Patient ID: Ryan Villanueva; [id number]; 05/09/1930   Admit date: 01/10/2017 Date of Consult: 01/09/2017  Primary Care Provider: Corky DownsMasoud, Javed, MD Primary Cardiologist: [facility] Reason for consult bradycardia, shortness of breath:  Physician requesting consult : Dr. Amado CoeGouru   Patient Profile:   Ryan Villanueva is a 83 y.o. male with a hx of coronary artery disease, bypass surgery 1990 , stroke August 2015 , stroke, failure to thrive, dementia , atrial flutter,  chronic bradycardia, long smoking history presenting with falls, weakness, abdominal swelling, presenting to the emergency room, found to have hypoxia, large pleural effusion    History of Present Illness:   Ryan Villanueva is a poor historian, most of the history obtained by the daughter .  Other family at the bedside. He is currently on BiPAP, agitated in bed  Thoracentesis performed January 04, 2017 with 1.1 L removed and improved respiratory status  Since then has had worsening hypoxia, increasing oxygen requirements now up to more than 6 L Placed on BiPAP for support, appears more agitated.  Telemetry reviewed, showing atrial flutter ventricular rate in the 40s Review of prior telemetry, appear to have possible atrial fibrillation Family reports long history of heart rates in the 40s, typically asymptomatic Was previously sent to [place] for possible pacemaker,  Seen by Dr. Johney FrameAllred, EP.  Not a candidate for pacemaker Was also felt not to be a good candidate for anticoagulation, secondary to acute knee effusion at that time  CHADS2VASC of 7.     Past Medical History:  Diagnosis Date  . Abnormal prostate specific antigen 03/30/2008   Normal prostate biopsy Dr. Jamelle RushingStioff 02/16/2013. Chronic and acutely inflamed prostate tissue.    . Atrial flutter (HCC)   . CAD in native artery 03/01/2008   [facility]  . Cerebral infarct (HCC) 10/07/2013   dysarthria, arm weakness  . Early onset  Alzheimer's dementia   . Hypertension     Past Surgical History:  Procedure Laterality Date  . CORONARY ARTERY BYPASS GRAFT    . History of coronary artery bypass graft  1990   [facility]     Home Medications:  Prior to Admission medications   Medication Sig Start Date End Date Taking? Authorizing Provider  amLODipine (NORVASC) 10 MG tablet TAKE 1 TABLET EVERY DAY 11/27/14  Yes Malva LimesFisher, Donald E, MD  atorvastatin (LIPITOR) 40 MG tablet Take 1 tablet (40 mg total) by mouth daily. 12/15/14  Yes Malva LimesFisher, Donald E, MD  clopidogrel (PLAVIX) 75 MG tablet Take 75 mg by mouth daily.   Yes [provider]  lamoTRIgine (LAMICTAL) 25 MG tablet Take 25 mg 2 (two) times daily by mouth.    Yes [provider]  memantine (NAMENDA) 5 MG tablet Take 5 mg daily by mouth.   Yes [provider]  MULTIPLE VITAMIN PO Take 1 tablet by mouth daily. 03/01/08  Yes [provider]  acetaminophen (TYLENOL) 325 MG tablet Take 650 mg by mouth every 6 (six) hours as needed.    [provider]  Rivaroxaban (XARELTO) 15 MG TABS tablet Take 1 tablet (15 mg total) by mouth daily with supper. Patient not taking: Reported on 07/30/2016 08/17/15   Iran OuchArida, Muhammad A, MD    Inpatient Medications: Scheduled Meds: . amLODipine  10 mg Oral Daily  . docusate sodium  100 mg Oral BID  . ipratropium-albuterol  3 mL Nebulization TID  . lamoTRIgine  25 mg Oral  BID  . methylPREDNISolone (SOLU-MEDROL) injection  40 mg Intravenous Q12H  . sodium chloride flush  3 mL Intravenous Q12H  . tamsulosin  0.8 mg Oral QPC supper   Continuous Infusions: . sodium chloride    . furosemide (LASIX) infusion     PRN Meds: sodium chloride, acetaminophen **OR** acetaminophen, albuterol, antiseptic oral rinse, bisacodyl, glycopyrrolate **OR** [DISCONTINUED] glycopyrrolate **OR** glycopyrrolate, [DISCONTINUED] haloperidol **OR** haloperidol **OR** haloperidol lactate, LORazepam, morphine injection,  ondansetron **OR** ondansetron (ZOFRAN) IV, polyvinyl alcohol, sodium chloride flush  Allergies:   No Known Allergies  Social History:   Social History   Socioeconomic History  . Marital status: Married    Spouse name: Not on file  . Number of children: Not on file  . Years of education: Not on file  . Highest education level: Not on file  Social Needs  . Financial resource strain: Not on file  . Food insecurity - worry: Not on file  . Food insecurity - inability: Not on file  . Transportation needs - medical: Not on file  . Transportation needs - non-medical: Not on file  Occupational History  . Not on file  Tobacco Use  . Smoking status: Former Smoker    Years: 30.00    Types: Cigarettes  . Smokeless tobacco: Never Used  . Tobacco comment: quit smoking in the 1990's uses smokeless tobacco  Substance and Sexual Activity  . Alcohol use: No  . Drug use: No  . Sexual activity: Not on file  Other Topics Concern  . Not on file  Social History Narrative  . Not on file    Family History:   . Cancer Mother  . Cancer Father   ROS:  Please see the history of present illness.  Review of Systems  Unable to perform ROS: dementia     Physical Exam/Data:   Vitals:   01/09/17 0800 01/09/17 0920 01/09/17 0938 01/09/17 1458  BP: 139/87     Pulse:  62    Resp:      Temp:      TempSrc:      SpO2: (!) 87% 98% 95% 92%  Weight:      Height:        Intake/Output Summary (Last 24 hours) at 01/09/2017 1728 Last data filed at 01/09/2017 1002 Gross per 24 hour  Intake 360 ml  Output 50 ml  Net 310 ml   Filed Weights   01/06/17 0427 01/08/17 0435 01/09/17 0423  Weight: 184 lb (83.5 kg) 189 lb (85.7 kg) 177 lb (80.3 kg)   Body mass index is 27.72 kg/m.  GENERAL: lying in the bed BiPAP in place, restless moving side to side EYES: PERRLA,  HEENT: Head atraumatic, normocephalic. Oropharynx and nasopharynx clear.  NECK:  Supple, no jugular venous distention. No thyroid  enlargement, no tenderness.  LUNGS: Increased work of breathing, BiPAP in place, dullness at the left base halfway up, otherwise Rales bilaterally CARDIOVASCULAR: Bradycardic, regular,   3/6 SEM RSB, no rubs, or gallops.  ABDOMEN: Soft, nontender, nondistended. Bowel sounds present. No organomegaly or mass.  EXTREMITIES: No pedal edema, cyanosis, or clubbing.  NEUROLOGIC: Unable to test, lethargic, noncommunicative, able to move all 4 extremities PSYCHIATRIC: The patient is sleepy,  poor historian SKIN: No obvious rash, lesion, or ulcer.     EKG:  The EKG was personally reviewed and demonstrates: Atrial flutter with ventricular rate 43 bpm, right bundle branch morphology Telemetry:  Telemetry was personally reviewed and demonstrates: Atrial flutter  Relevant CV Studies:  Echocardiogram with normal LV systolic function, dilated right atrium, right ventricle, d  shaped septum consistent with elevated right heart volume/pressure  Laboratory Data:  Chemistry Recent Labs  Lab 01/07/17 0822 01/08/17 0403 01/09/17 0625  NA 144 140 139  K 4.7 5.7* 4.9  CL 115* 108 105  CO2 26 27 26   GLUCOSE 92 154* 127*  BUN 38* 51* 54*  CREATININE 2.40* 2.89* 2.93*  CALCIUM 7.1* 7.8* 8.0*  GFRNONAA 23* 19* 18*  GFRAA 27* 22* 21*  ANIONGAP 3* 5 8    Recent Labs  Lab 16-Nov-2016 1237 01/09/17 0625  PROT 6.4*  --   ALBUMIN 3.3* 3.1*  AST 43*  --   ALT 25  --   ALKPHOS 191*  --   BILITOT 1.0  --    Hematology Recent Labs  Lab 16-Nov-2016 1237 01/05/17 0428 01/09/17 0626  WBC 7.0 6.2 8.4  RBC 4.19* 4.13* 4.09*  HGB 12.0* 11.7* 11.6*  HCT 36.7* 36.3* 36.8*  MCV 87.6 87.9 90.0  MCH 28.6 28.4 28.5  MCHC 32.6 32.3 31.6*  RDW 18.1* 18.3* 18.6*  PLT 277 236 307   Cardiac Enzymes Recent Labs  Lab 16-Nov-2016 1237  TROPONINI 0.05*   No results for input(s): TROPIPOC in the last 168 hours.  BNP Recent Labs  Lab 16-Nov-2016 1237  BNP 898.0*    DDimer No results for input(s): DDIMER in the  last 168 hours.  Radiology/Studies:  Dg Chest 1 View  Result Date: 01/09/2017 CLINICAL DATA:  Status post left thoracentesis EXAM: CHEST 1 VIEW COMPARISON:  Film from earlier in the same day FINDINGS: Significant reduction in left-sided pleural effusion is noted. Some persistent bibasilar atelectasis and mild right-sided effusion are seen. Postsurgical changes are again noted. The cardiac shadow remains enlarged. No pneumothorax is seen. IMPRESSION: No pneumothorax following left thoracentesis. Electronically Signed   By: Alcide CleverMark  Lukens M.D.   On: 01/09/2017 15:05   [place] Renal  Result Date: 01/06/2017 CLINICAL DATA:  Acute renal failure. EXAM: RENAL / URINARY TRACT ULTRASOUND COMPLETE COMPARISON:  None. FINDINGS: Right Kidney: Length: 11.1 cm.  Contains a 1.8 x 1.3 x 1.8 cm cysts. Left Kidney: Length: 11.2 cm. Echogenicity within normal limits. No mass or hydronephrosis visualized. Bladder: The bladder is decompressed with a Foley catheter limiting evaluation. IMPRESSION: No cause for acute renal failure identified. Electronically Signed   By: Gerome Samavid  Williams III M.D   On: 01/06/2017 16:28   Dg Chest Port 1 View  Result Date: 01/09/2017 CLINICAL DATA:  Shortness of breath. Started this morning. History of atrial flutter. EXAM: PORTABLE CHEST 1 VIEW COMPARISON:  01/06/2017 FINDINGS: Bilateral diffuse interstitial thickening. Trace right pleural effusion. Moderate -large left pleural effusion. No pneumothorax. Stable cardiomegaly. Prior CABG. No acute osseous abnormality. IMPRESSION: Findings most concerning for CHF. Electronically Signed   By: Elige KoHetal  Patel   On: 01/09/2017 09:56   Dg Chest Port 1 View  Result Date: 01/06/2017 CLINICAL DATA:  Shortness of breath. EXAM: PORTABLE CHEST 1 VIEW COMPARISON:  Radiograph yesterday, additional priors. FINDINGS: The heart is enlarged, unchanged from prior exam. Post median sternotomy. Left lung base opacity likely combination of pleural fluid and atelectasis or  pneumonia. No pneumothorax. Vascular congestion. Possible tiny right pleural effusion. IMPRESSION: Unchanged appearance of the chest with cardiomegaly, left pleural effusion with basilar opacity and vascular congestion. Electronically Signed   By: Rubye OaksMelanie  Ehinger M.D.   On: 01/06/2017 06:01   [place] Thoracentesis Asp Pleural Space W/img Guide  Result Date: 01/09/2017 INDICATION:  Left-sided pleural effusion EXAM: ULTRASOUND GUIDED LEFT THORACENTESIS MEDICATIONS: None. COMPLICATIONS: None immediate. PROCEDURE: An ultrasound guided thoracentesis was thoroughly discussed with the patient's family and questions answered. The benefits, risks, alternatives and complications were also discussed. The patient's family understands and wishes to proceed with the procedure. Written consent was obtained. Ultrasound was performed to localize and mark an adequate pocket of fluid in the left chest. The area was then prepped and draped in the normal sterile fashion. 1% Lidocaine was used for local anesthesia. Under ultrasound guidance a 6 Fr Safe-T-Centesis catheter was introduced. Thoracentesis was performed. The catheter was removed and a dressing applied. FINDINGS: A total of approximately 950 mL of blood tinged fluid was removed. IMPRESSION: Successful ultrasound guided left thoracentesis yielding 950 mL of pleural fluid. Electronically Signed   By: [name]   On: 01/09/2017 15:04    Assessment and Plan:   1. Bradycardia /atrial flutter Long history of atrial flutter with slow ventricular rate Maintaining adequate blood pressure, even on amlodipine for hypertension, systolic 130 on my check Would likely not playing a significant role in his presentation Family reports a long history of bradycardia dating back more than 1 year.   2) acute respiratory distress Secondary to underlying COPD/long smoking history, in the setting of pulmonary hypertension And large left recurrent pleural effusion Plan is for  thoracentesis, repeat today for respiratory distress Not a good candidate for pleurodesis catheter given agitation, pulled out his own urine Foley catheter this morning with dramatic hematuria -Not a good candidate for hemodialysis given agitation and dementia Consider palliative care  3) pulmonary hypertension Likely secondary to long-standing COPD/underlying lung disease Not a good candidate for right heart catheterization or workup Has not tolerated diuretics given worsening renal failure Consider Lasix infusion if tolerated given few other options   4) acute renal failure Dramatic worsening over the past several months Presented with creatinine 2.2, baseline 1.35 Unable to aggressively pursue diuresis.  Not a good candidate for dialysis  5) dementia Required Ativan IV prior to thoracentesis Poor historian, pulled out his Foley catheter with dramatic hematuria this morning Not a good candidate for aggressive treatments   Total encounter time more than 110 minutes  Greater than 50% was spent in counseling and coordination of care with the patient    For questions or updates, please contact [facility] Please consult [email or web address] for contact info under Cardiology/STEMI.   Signed, Julien Nordmann, MD  01/09/2017 5:28 PM

## 2017-01-09 NOTE — Procedures (Signed)
Left thoracentesis without difficulty  Complications:  None  Blood Loss: none  See dictation in canopy pacs  

## 2017-01-09 NOTE — Progress Notes (Signed)
[facility] - [place] at [facility]   PATIENT NAME: Ryan Villanueva    MR#:  [id number]  DATE OF BIRTH:  01/14/1931  SUBJECTIVE:  CHIEF COMPLAINT:   Chief Complaint  Patient presents with  . Fall    had SOB, Found left sided pleural effusion, s/p thoracentesis.  Have terminal dementia, pt is desaturating on 6 ltr oxygen.  Working hard to breathe today patient had 40 years of smoking history and used to smoke 2-3 packs of cigarettes.  Wife and daughter at bedside  REVIEW OF SYSTEMS:  Pt is demented.  ROS  DRUG ALLERGIES:  No Known Allergies  VITALS:  Blood pressure 139/87, pulse 62, temperature 97.8 F (36.6 C), resp. rate 20, height 5\' 7"  (1.702 m), weight 80.3 kg (177 lb), SpO2 95 %.  PHYSICAL EXAMINATION:  GENERAL:  83 y.o.-year-old patient lying in the bed with acute distress.  EYES: Pupils equal, round, reactive to light and accommodation. No scleral icterus. Extraocular muscles intact.  HEENT: Head atraumatic, normocephalic. Oropharynx and nasopharynx clear.  NECK:  Supple, no jugular venous distention. No thyroid enlargement, no tenderness.  LUNGS: Diminished breath sounds bilaterally, has min wheezing, has rales, no rhonchi or crepitation. No use of accessory muscles of respiration.  CARDIOVASCULAR: S1, S2 normal. No murmurs, rubs, or gallops.  ABDOMEN: Soft, nontender, nondistended. Bowel sounds present. EXTREMITIES: No pedal edema, cyanosis, or clubbing.  NEUROLOGIC: Lethargic, chronically demented.  PSYCHIATRIC: The patient is alert, but disoriented  SKIN: No obvious rash, lesion, or ulcer.   Physical Exam LABORATORY PANEL:   CBC Recent Labs  Lab 01/05/17 0428  WBC 6.2  HGB 11.7*  HCT 36.3*  PLT 236   ------------------------------------------------------------------------------------------------------------------  Chemistries  Recent Labs  Lab 01/23/2017 1237  01/07/17 0822  01/09/17 0625  NA 146*   < > 144   < > 139  K 4.8   < > 4.7    < > 4.9  CL 111   < > 115*   < > 105  CO2 29   < > 26   < > 26  GLUCOSE 113*   < > 92   < > 127*  BUN 35*   < > 38*   < > 54*  CREATININE 2.21*   < > 2.40*   < > 2.93*  CALCIUM 8.4*   < > 7.1*   < > 8.0*  MG  --   --  2.2  --   --   AST 43*  --   --   --   --   ALT 25  --   --   --   --   ALKPHOS 191*  --   --   --   --   BILITOT 1.0  --   --   --   --    < > = values in this interval not displayed.   ------------------------------------------------------------------------------------------------------------------  Cardiac Enzymes Recent Labs  Lab 01/10/2017 1237  TROPONINI 0.05*   ------------------------------------------------------------------------------------------------------------------  RADIOLOGY:  Dg Chest Port 1 View  Result Date: 01/09/2017 CLINICAL DATA:  Shortness of breath. Started this morning. History of atrial flutter. EXAM: PORTABLE CHEST 1 VIEW COMPARISON:  01/06/2017 FINDINGS: Bilateral diffuse interstitial thickening. Trace right pleural effusion. Moderate -large left pleural effusion. No pneumothorax. Stable cardiomegaly. Prior CABG. No acute osseous abnormality. IMPRESSION: Findings most concerning for CHF. Electronically Signed   By: Elige KoHetal  Patel   On: 01/09/2017 09:56    ASSESSMENT AND PLAN:   Active Problems:  Large pleural effusion  83 year old male with a known history of coronary artery disease status post CABG, dementia, hypertension is being admitted for hypoxia there is a large left pleural effusion, ascites and acute renal failure  *Acute hypoxic respiratory failure with hypoxia and hypercapnia  Patient is more disoriented today than his baseline Stepdown unit for BiPAP, currently patient is on high flow oxygen Status post thoracentesis for large left-sided pleural effusion,1.1 ltr fluid removed, follow-up on fluid culture Nebulizer treatments IV Solu-Medrol for COPD exacerbation Consulted intensivist- Repeat chest x-ray   *Acute on  chronic kidney disease stage III -Monitor with gentle hydration -Avoid nephrotoxic medication -Creatinine at 2.21, 2.42-2.41--2.40 -2.89-2.9 base line at 1.35 -follow with  nephrology, discontinue IV fluids, Foley catheter to monitor urine output Foley catheter placed for urinary retention,  *Hyperkalemia Kayexalate and recheck 4.9  *Sinus bradycardia Patient is asymptomatic.  Heart rate is dropping down to 39 Not on any rate limiting drugs.  Cardiology consult placed to [facility]  * Mixed Dementia, Vascular Dementia and Alzheimer's Disease:  -Continue Aricept, with Dr. Sherryll BurgerShah neurology  * History of stroke -resumed Plavix and Lipitor which was held for thoracentesis -Lipitor may not be ideal with him having recurrent falls  - physical and occupational therapy evaluation.  Recommending skilled nursing facility    All the records are reviewed and case discussed with Care Management/Social Workerr. Management plans discussed with the patient, daughter , wife at bedside and they are in agreement.  CODE STATUS: DNR  TOTAL Critical care TIME TAKING CARE OF THIS PATIENT: 39 minutes.     POSSIBLE D/C IN ?DAYS, DEPENDING ON CLINICAL CONDITION.   Ramonita LabGouru, Grettell Ransdell M.D on 01/09/2017   Between 7am to 6pm - Pager - [phone]   After 6pm go to [email or web address] - password [email or web address]  [facility]  Office  [phone]  CC: Primary care physician; Corky DownsMasoud, Javed, MD  Note: This dictation was prepared with Dragon dictation along with smaller phrase technology. Any transcriptional errors that result from this process are unintentional.

## 2017-01-09 NOTE — Progress Notes (Signed)
Pt exhibited labored breathing this a.m. Oxygen at 5 liters, patient sat 79-84. MD and resp therapist notified. O2 bumped to 7 liters, sats 85-95.  AbBs ordered, pt started on highflow. ABGs results given to DR. Pt ordered to be started on bipap and moved to CCU. Awaiting Dr. Amado CoeGouru to see patient, and pt is awaiting chest xray

## 2017-01-09 NOTE — Plan of Care (Deleted)
In to see patient. He is in US. Will follow up today or in the morning as time allows.

## 2017-01-09 NOTE — NC FL2 (Signed)
[place] MEDICAID FL2 LEVEL OF CARE SCREENING TOOL     IDENTIFICATION  Patient Name: Ryan Villanueva Birthdate: 08/24/1930 Sex: male Admission Date (Current Location): 01/06/2017  [place]   Facility and Address:  [facility], [address], [place], [place] [address]      Provider Number: [id number]  Attending Physician Name and Address:  Ramonita LabGouru, Aruna, MD  Relative Name and Phone Number:  Wendy PoetJoyce,Ella R Spouse [phone] or Renold GentaBradshaw,Kim Daughter   [phone]     Current Level of Care: Hospital Recommended Level of Care: Skilled Nursing Facility Prior Approval Number:    Date Approved/Denied:   PASRR Number:  Pending  Discharge Plan: SNF    Current Diagnoses: Patient Active Problem List   Diagnosis Date Noted  . Large pleural effusion November 11, 2016  . Knee pain 05/30/2015  . AV heart block 05/22/2015  . Atypical atrial flutter (HCC)   . Iron deficiency anemia 03/30/2015  . Acute renal insufficiency 03/30/2015  . H/O coronary artery bypass surgery 11/25/2014  . Late effects of CVA (cerebrovascular accident) 11/25/2014  . Atrial flutter (HCC) 11/04/2014  . Bradycardia 11/04/2014  . 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 HB (heart block) 11/04/2014  . Speech disorder 11/04/2014  . CAD in native artery 03/01/2008  . Arthritis due to gout 03/05/2005  . Essential (primary) hypertension 03/05/1988  . Hypercholesteremia 03/05/1988    Orientation RESPIRATION BLADDER Height & Weight     Self, Place  O2(6L) Continent Weight: 177 lb (80.3 kg) Height:  5\' 7"  (170.2 cm)  BEHAVIORAL SYMPTOMS/MOOD NEUROLOGICAL BOWEL NUTRITION STATUS      Incontinent Diet(Renal diet)  AMBULATORY STATUS COMMUNICATION OF NEEDS Skin   Limited Assist Verbally Normal                       Personal Care Assistance Level of Assistance  Bathing, Feeding, Dressing Bathing Assistance: Limited assistance Feeding  assistance: Limited assistance Dressing Assistance: Limited assistance     Functional Limitations Info  Sight, Hearing, Speech Sight Info: Adequate Hearing Info: Adequate Speech Info: Adequate    SPECIAL CARE FACTORS FREQUENCY  PT (By licensed PT), OT (By licensed OT)     PT Frequency: 5x a week OT Frequency: 5x a week            Contractures Contractures Info: Not present    Additional Factors Info  Code Status, Allergies Code Status Info: DNR Allergies Info: NKA           Current Medications (01/09/2017):  This is the current hospital active medication list Current Facility-Administered Medications  Medication Dose Route Frequency Provider Last Rate Last Dose  . acetaminophen (TYLENOL) tablet 650 mg  650 mg Oral Q6H PRN Delfino LovettShah, Vipul, MD       Or  . acetaminophen (TYLENOL) suppository 650 mg  650 mg Rectal Q6H PRN Delfino LovettShah, Vipul, MD      . albuterol (PROVENTIL) (2.5 MG/3ML) 0.083% nebulizer solution 2.5 mg  2.5 mg Nebulization Q4H PRN Gouru, Aruna, MD      . amLODipine (NORVASC) tablet 10 mg  10 mg Oral Daily Sherryll BurgerShah, Vipul, MD   10 mg at 01/08/17 1053  . bisacodyl (DULCOLAX) EC tablet 5 mg  5 mg Oral Daily PRN Delfino LovettShah, Vipul, MD      . docusate sodium (COLACE) capsule 100 mg  100 mg Oral BID Delfino LovettShah, Vipul, MD   100 mg at 01/08/17 2057  . furosemide (LASIX) injection  40 mg  40 mg Intravenous Q12H Kolluru, Sarath, MD   40 mg at 01/09/17 0500  . furosemide (LASIX) tablet 20 mg  20 mg Oral Daily Kolluru, Sarath, MD   20 mg at 01/08/17 1043  . ipratropium-albuterol (DUONEB) 0.5-2.5 (3) MG/3ML nebulizer solution 3 mL  3 mL Nebulization TID Ramonita LabGouru, Aruna, MD   3 mL at 01/09/17 0729  . lamoTRIgine (LAMICTAL) tablet 25 mg  25 mg Oral BID Ramonita LabGouru, Aruna, MD   25 mg at 01/08/17 2057  . memantine (NAMENDA) tablet 5 mg  5 mg Oral Daily Gouru, Aruna, MD   5 mg at 01/08/17 1353  . ondansetron (ZOFRAN) tablet 4 mg  4 mg Oral Q6H PRN Delfino LovettShah, Vipul, MD       Or  . ondansetron (ZOFRAN) injection 4 mg   4 mg Intravenous Q6H PRN Delfino LovettShah, Vipul, MD      . predniSONE (DELTASONE) tablet 50 mg  50 mg Oral Q breakfast Gouru, Aruna, MD   50 mg at 01/08/17 1046  . tamsulosin (FLOMAX) capsule 0.8 mg  0.8 mg Oral QPC supper Lamont DowdyKolluru, Sarath, MD   0.8 mg at 01/08/17 1811     Discharge Medications: Please see discharge summary for a list of discharge medications.  Relevant Imaging Results:  Relevant Lab Results:   Additional Information SS#: [id number]  [place] [employer], Richel Millspaugh R, LCSWA

## 2017-01-09 NOTE — Progress Notes (Signed)
OT Cancellation Note  Patient Details Name: Ryan BurdockRufus L Hendrix MRN: [id number] DOB: 05/16/1930   Cancelled Treatment:    Reason Eval/Treat Not Completed: Other (comment). Chart reviewed. Per chart, pt desats on 6L O2, now on HFNC 40L. Palliative consult pending. Will hold OT and continue to follow acutely for appropriateness of OT treatment pending POC.  Richrd PrimeJamie Stiller, MPH, MS, OTR/L ascom [phone] 01/09/17, 12:47 PM

## 2017-01-10 DIAGNOSIS — Z9889 Other specified postprocedural states: Secondary | ICD-10-CM

## 2017-01-10 LAB — CYTOLOGY - NON PAP

## 2017-01-10 MED ORDER — SCOPOLAMINE 1 MG/3DAYS TD PT72
1.0000 | MEDICATED_PATCH | TRANSDERMAL | Status: DC
  Administered 2017-01-10: 1.5 mg via TRANSDERMAL
  Filled 2017-01-10: qty 1

## 2017-01-10 MED ORDER — MORPHINE SULFATE (PF) 2 MG/ML IV SOLN
1.0000 mg | INTRAVENOUS | Status: DC | PRN
  Administered 2017-01-10 – 2017-01-11 (×2): 2 mg via INTRAVENOUS
  Filled 2017-01-10 (×2): qty 1

## 2017-01-10 NOTE — Progress Notes (Signed)
[employer] [facility] - [place] at [facility]   PATIENT NAME: Ryan HansenRufus Villanueva    MR#:  [id number]  DATE OF BIRTH:  01/06/1931  SUBJECTIVE:  CHIEF COMPLAINT:   Chief Complaint  Patient presents with  . Fall   Patient currently unresponsive multiple family members at bedside Is made comfort care yesterday  REVIEW OF SYSTEMS:  Pt is demented.  ROS  DRUG ALLERGIES:  No Known Allergies  VITALS:  Blood pressure (!) 150/53, pulse (!) 57, temperature 97.7 F (36.5 C), temperature source Oral, resp. rate 20, height 5\' 7"  (1.702 m), weight 177 lb (80.3 kg), SpO2 93 %.  PHYSICAL EXAMINATION:  GENERAL:  83 y.o.-year-old patient lying in the bed with acute distress.  EYES: Pupils equal, round, reactive to light and accommodation. No scleral icterus.  HEENT: Head atraumatic, normocephalic. Oropharynx and nasopharynx clear.  NECK:  Supple, no jugular venous distention. No thyroid enlargement, no tenderness.  LUNGS: Diminished breath sounds bilaterally, has min wheezing, has rales, no rhonchi or crepitation. No use of accessory muscles of respiration.  CARDIOVASCULAR: S1, S2 normal. No murmurs, rubs, or gallops.  ABDOMEN: Soft, nontender, nondistended. Bowel sounds present. EXTREMITIES: No pedal edema, cyanosis, or clubbing.  NEUROLOGIC: Lethargic, chronically demented.  PSYCHIATRIC: Lethargic SKIN: No obvious rash, lesion, or ulcer.   Physical Exam LABORATORY PANEL:   CBC Recent Labs  Lab 01/09/17 0626  WBC 8.4  HGB 11.6*  HCT 36.8*  PLT 307   ------------------------------------------------------------------------------------------------------------------  Chemistries  Recent Labs  Lab 07-08-2016 1237  01/07/17 0822  01/09/17 0625  NA 146*   < > 144   < > 139  K 4.8   < > 4.7   < > 4.9  CL 111   < > 115*   < > 105  CO2 29   < > 26   < > 26  GLUCOSE 113*   < > 92   < > 127*  BUN 35*   < > 38*   < > 54*  CREATININE 2.21*   < > 2.40*   < > 2.93*  CALCIUM 8.4*   < >  7.1*   < > 8.0*  MG  --   --  2.2  --   --   AST 43*  --   --   --   --   ALT 25  --   --   --   --   ALKPHOS 191*  --   --   --   --   BILITOT 1.0  --   --   --   --    < > = values in this interval not displayed.   ------------------------------------------------------------------------------------------------------------------  Cardiac Enzymes Recent Labs  Lab 07-08-2016 1237  TROPONINI 0.05*   ------------------------------------------------------------------------------------------------------------------  RADIOLOGY:  Dg Chest 1 View  Result Date: 01/09/2017 CLINICAL DATA:  Status post left thoracentesis EXAM: CHEST 1 VIEW COMPARISON:  Film from earlier in the same day FINDINGS: Significant reduction in left-sided pleural effusion is noted. Some persistent bibasilar atelectasis and mild right-sided effusion are seen. Postsurgical changes are again noted. The cardiac shadow remains enlarged. No pneumothorax is seen. IMPRESSION: No pneumothorax following left thoracentesis. Electronically Signed   By: Alcide CleverMark  Lukens M.D.   On: 01/09/2017 15:05   Dg Chest Port 1 View  Result Date: 01/09/2017 CLINICAL DATA:  Shortness of breath. Started this morning. History of atrial flutter. EXAM: PORTABLE CHEST 1 VIEW COMPARISON:  01/06/2017 FINDINGS: Bilateral diffuse interstitial thickening. Trace right pleural effusion.  Moderate -large left pleural effusion. No pneumothorax. Stable cardiomegaly. Prior CABG. No acute osseous abnormality. IMPRESSION: Findings most concerning for CHF. Electronically Signed   By: Elige KoHetal  Ketzia Guzek   On: 01/09/2017 09:56   [place] Thoracentesis Asp Pleural Space W/img Guide  Result Date: 01/09/2017 INDICATION: Left-sided pleural effusion EXAM: ULTRASOUND GUIDED LEFT THORACENTESIS MEDICATIONS: None. COMPLICATIONS: None immediate. PROCEDURE: An ultrasound guided thoracentesis was thoroughly discussed with the patient's family and questions answered. The benefits, risks, alternatives  and complications were also discussed. The patient's family understands and wishes to proceed with the procedure. Written consent was obtained. Ultrasound was performed to localize and mark an adequate pocket of fluid in the left chest. The area was then prepped and draped in the normal sterile fashion. 1% Lidocaine was used for local anesthesia. Under ultrasound guidance a 6 Fr Safe-T-Centesis catheter was introduced. Thoracentesis was performed. The catheter was removed and a dressing applied. FINDINGS: A total of approximately 950 mL of blood tinged fluid was removed. IMPRESSION: Successful ultrasound guided left thoracentesis yielding 950 mL of pleural fluid. Electronically Signed   By: Alcide CleverMark  Lukens M.D.   On: 01/09/2017 15:04    ASSESSMENT AND PLAN:   Active Problems:   Large pleural effusion  83 year old male with a known history of coronary artery disease status post CABG, dementia, hypertension is being admitted for hypoxia there is a large left pleural effusion, ascites and acute renal failure  *Acute hypoxic respiratory failure with hypoxia and hypercapnia  Now comfort care Oxygen for supportive care Add scopolamine for secretions  *Acute on chronic kidney disease stage III Supportive care  *Hyperkalemia No further workup because patient is comfort care  *Sinus bradycardia Patient is asymptomatic.  Comfort care  * Mixed Dementia, Vascular Dementia and Alzheimer's Disease:   Patient's medications discontinued due to comfort care  * History of stroke -all meds discontinued due to comfort care   Disposition based on patient's clinical course may need to go to hospice home    All the records are reviewed and case discussed with Care Management/Social Workerr. Management plans discussed with the patient, daughter , wife at bedside and they are in agreement.  CODE STATUS: DNR  TOTAL Critical care TIME TAKING CARE OF THIS PATIENT: 25 minutes.     POSSIBLE D/C IN  ?DAYS, DEPENDING ON CLINICAL CONDITION.   [name], Taurean Ju M.D on 01/10/2017   Between 7am to 6pm - Pager - [phone]   After 6pm go to [email or web address] - password [facility] [facility]  [facility] [place] [facility]  Office  [phone]  CC: Primary care physician; Corky DownsMasoud, Javed, MD  Note: This dictation was prepared with Dragon dictation along with smaller phrase technology. Any transcriptional errors that result from this process are unintentional.

## 2017-01-10 NOTE — Progress Notes (Signed)
Pt is comfort care. Has been unresponsive since I picked up patient at 1500. BiPAP removed around 1730 once all family was present, pt placed on nasal cannula for comfort. Family at bedside throughout the evening, updated on current plan of care and understanding goal is for patient comfort at this time. Pt unable to take any oral medications.

## 2017-01-10 NOTE — Progress Notes (Signed)
Daily Progress Note   Patient Name: Ryan BurdockRufus L Villanueva       Date: 01/10/2017 DOB: 06/03/1930  Age: 83 y.o. MRN#: [id number] Attending Physician: [name], Shreyang, MD Primary Care Physician: Corky DownsMasoud, Javed, MD Admit Date: 01/16/2017  Reason for Consultation/Follow-up: Terminal Care  Subjective: Ryan Villanueva is resting in bed. Family at bedside.  He is not responsive at this time, and per staff has not been since visit yesterday. He has a [place] in place. Medications ordered for comfort. Anticipated hospital death.   Length of Stay: 6  Current Medications: Scheduled Meds:  . sodium chloride flush  3 mL Intravenous Q12H    Continuous Infusions: . sodium chloride      PRN Meds: sodium chloride, acetaminophen **OR** acetaminophen, albuterol, bisacodyl, glycopyrrolate **OR** [DISCONTINUED] glycopyrrolate **OR** glycopyrrolate, [DISCONTINUED] haloperidol **OR** haloperidol **OR** haloperidol lactate, LORazepam, morphine injection, ondansetron **OR** ondansetron (ZOFRAN) IV, polyvinyl alcohol, sodium chloride flush  Physical Exam  Constitutional: No distress.  Pulmonary/Chest:  [place] in place.             Vital Signs: BP (!) 150/53 (BP Location: Right Arm)   Pulse (!) 57   Temp 97.7 F (36.5 C) (Oral)   Resp 20   Ht 5\' 7"  (1.702 m)   Wt 80.3 kg (177 lb)   SpO2 93%   BMI 27.72 kg/m  SpO2: SpO2: 93 % O2 Device: O2 Device: Nasal Cannula O2 Flow Rate: O2 Flow Rate (L/min): 3 L/min  Intake/output summary:   Intake/Output Summary (Last 24 hours) at 01/10/2017 0917 Last data filed at 01/09/2017 1002 Gross per 24 hour  Intake 360 ml  Output -  Net 360 ml   LBM: Last BM Date: 01/08/17 Baseline Weight: Weight: 82.1 kg (181 lb) Most recent weight: Weight: 80.3 kg (177 lb)       Palliative  Assessment/Data: 20%      Patient Active Problem List   Diagnosis Date Noted  . Large pleural effusion 01/03/2017  . Knee pain 05/30/2015  . AV heart block 05/22/2015  . Atypical atrial flutter (HCC)   . Iron deficiency anemia 03/30/2015  . Acute renal insufficiency 03/30/2015  . H/O coronary artery bypass surgery 11/25/2014  . Late effects of CVA (cerebrovascular accident) 11/25/2014  . Atrial flutter (HCC) 11/04/2014  . Bradycardia 11/04/2014  . Mixed vascular and neurodegenerative  dementia 11/04/2014  . HB (heart block) 11/04/2014  . Speech disorder 11/04/2014  . CAD in native artery 03/01/2008  . Arthritis due to gout 03/05/2005  . Essential (primary) hypertension 03/05/1988  . Hypercholesteremia 03/05/1988    Palliative Care Assessment & Plan   Patient Profile: Ryan Villanueva a known history of CAD and stroke who  presented to the emergency department today with falls x3 this week. The family wAs especially concerned because they have noticed increased swellingonhis abdomen as well as bilateral upper extremities. Patient also found to be hypoxic to 86% on room air in triage and does not wear oxygen at home. Patient also noted to be bradycardic but has history of bradycardia per family.Chest x-ray shows large left pleural effusion.Is a difficult historian due to his dementia.   Assessment: Ryan Villanueva is not responsive, resting in bed with [place].   Recommendations/Plan:  Anticipated hospital death.   Goals of Care and Additional Recommendations:  Limitations on Scope of Treatment: Full Comfort Care  Code Status:    Code Status Orders  (From admission, onward)        Start     Ordered   01/09/17 1554  Do not attempt resuscitation (DNR)  Continuous    Question Answer Comment  In the event of cardiac or respiratory ARREST Do not call a "code blue"   In the event of cardiac or respiratory ARREST Do not perform Intubation, CPR,  defibrillation or ACLS   In the event of cardiac or respiratory ARREST Use medication by any route, position, wound care, and other measures to relive pain and suffering. May use oxygen, suction and manual treatment of airway obstruction as needed for comfort.      01/09/17 1554    Code Status History    Date Active Date Inactive Code Status Order ID Comments User Context   01/18/2017 16:17 01/09/2017 15:54 DNR [id number]  Delfino LovettShah, Vipul, MD Inpatient   05/22/2015 02:22 05/25/2015 14:19 Full Code [id number]  Nevin BloodgoodAzeem, Amir Inpatient    Advance Directive Documentation     Most Recent Value  Type of Advance Directive  Healthcare Power of Attorney, Living will  Pre-existing out of facility DNR order (yellow form or pink MOST form)  No data  "MOST" Form in Place?  No data       Prognosis:   Hours - Days  Discharge Planning:  Anticipated Hospital Death    Thank you for allowing the Palliative Medicine Team to assist in the care of this patient.   Total Time 30 minutes Prolonged Time Billed No       Greater than 50%  of this time was spent counseling and coordinating care related to the above assessment and plan.  Morton Stallrystal Lenox Bink, NP 01/10/2017 9:24 AM Office: [phone] 7am-7pm  Pager: [phone] Call primary team after hours  Please contact Palliative Medicine Team phone at [phone] for questions and concerns.

## 2017-01-11 MED ORDER — DIPHENHYDRAMINE HCL 12.5 MG/5ML PO ELIX
12.5000 mg | ORAL_SOLUTION | Freq: Four times a day (QID) | ORAL | Status: DC | PRN
  Filled 2017-01-11: qty 5

## 2017-01-11 MED ORDER — MORPHINE SULFATE 2 MG/ML IV SOLN
INTRAVENOUS | Status: DC
Start: 2017-01-11 — End: 2017-01-11

## 2017-01-11 MED ORDER — SODIUM CHLORIDE 0.9 % IV SOLN
2.0000 mg/h | INTRAVENOUS | Status: DC
  Administered 2017-01-11: 11:00:00 2 mg/h via INTRAVENOUS
  Filled 2017-01-11: qty 10

## 2017-01-11 MED ORDER — DIPHENHYDRAMINE HCL 50 MG/ML IJ SOLN
12.5000 mg | Freq: Four times a day (QID) | INTRAMUSCULAR | Status: DC | PRN
  Filled 2017-01-11: qty 0.25

## 2017-01-11 MED ORDER — SODIUM CHLORIDE 0.9% FLUSH: 9.0000 mL | INTRAVENOUS | Status: DC | PRN

## 2017-01-11 MED ORDER — ONDANSETRON HCL 4 MG/2ML IJ SOLN: 4.0000 mg | Freq: Four times a day (QID) | INTRAMUSCULAR | Status: DC | PRN

## 2017-01-11 MED ORDER — MORPHINE BOLUS VIA INFUSION
2.0000 mg | INTRAVENOUS | Status: DC | PRN
  Filled 2017-01-11: qty 2

## 2017-01-11 MED ORDER — NALOXONE HCL 0.4 MG/ML IJ SOLN: 0.4000 mg | INTRAMUSCULAR | Status: DC | PRN

## 2017-02-02 NOTE — Death Summary Note (Signed)
[facility] [place] at [facility] Date of Admission: 01/20/2017 12:42 PM  Date of death:   Admitting diagnosis:  Fall Acute hypoxic respiratory failure Large left pleural effusion   Diagnosis at time of death 1.  Acute hypoxic respiratory failure 2.  Large pleural effusion 3.  Acute on chronic kidney disease 4.  Mixed dementia 5.  Bradycardia 6.  Previous history of stroke      Hospital course  Ryan CoupRufus Alona Villanueva  is a 83 y.o. male with a known history of CAD and stroke who is presenting to the emergency department today with falls x3 this week. The family is especially concerned today because they have noticed increased swelling on his abdomen as well as bilateral upper extremities. Patient also found to be hypoxic to 86% on room air in triage and does not wear oxygen at home.  Patient was noted to have a large left pleural effusion.  History of acute on chronic renal failure.  Patient underwent thoracentesis.  Continued to have intermittent respiratory difficulties.  Patient was not doing well.  A palliative care consult was obtained he was seen by palliative care team and was made comfort care.  Patient passed away earlier today.             TOTAL TIME TAKING CARE OF THIS PATIENT: 35 minutes.    Ryan Villanueva, [name] M.D on 01/18/2017 at 12:56 PM  Between 7am to 6pm - Pager - [phone]  After 6pm go to [email or web address] - password [email or web address] [facility]  [facility]  Office  [phone]  CC: Primary care physician; Corky DownsMasoud, Javed, MD

## 2017-02-02 NOTE — Progress Notes (Signed)
[facility] -  at [facility]   PATIENT NAME: Ryan Villanueva    MR#:  [id number]  DATE OF BIRTH:  12/30/1930  SUBJECTIVE:  CHIEF COMPLAINT:   Chief Complaint  Patient presents with  . Fall   According to the wife patient was very agitated last night currently having heavy respirations  REVIEW OF SYSTEMS:  Pt is demented.  ROS  DRUG ALLERGIES:  No Known Allergies  VITALS:  Blood pressure 124/76, pulse (!) 54, temperature (!) 96.5 F (35.8 C), temperature source Axillary, resp. rate 13, height 5\' 7"  (1.702 m), weight 177 lb (80.3 kg), SpO2 (!) 75 %.  PHYSICAL EXAMINATION:  GENERAL:  83 y.o.-year-old patient lying in the bed mild tachypnea EYES: Pupils equal, round, reactive to light and accommodation. No scleral icterus.  HEENT: Head atraumatic, normocephalic. Oropharynx and nasopharynx clear.  NECK:  Supple, no jugular venous distention. No thyroid enlargement, no tenderness.  LUNGS: Diminished breath sounds bilaterally, has min wheezing, has rales, no rhonchi or crepitation. No use of accessory muscles of respiration.  CARDIOVASCULAR: S1, S2 normal. No murmurs, rubs, or gallops.  ABDOMEN: Soft, nontender, nondistended. Bowel sounds present. EXTREMITIES: No pedal edema, cyanosis, or clubbing.  NEUROLOGIC: Lethargic, chronically demented.  PSYCHIATRIC: Lethargic SKIN: No obvious rash, lesion, or ulcer.   Physical Exam LABORATORY PANEL:   CBC Recent Labs  Lab 01/09/17 0626  WBC 8.4  HGB 11.6*  HCT 36.8*  PLT 307   ------------------------------------------------------------------------------------------------------------------  Chemistries  Recent Labs  Lab 01/07/17 0822  01/09/17 0625  NA 144   < > 139  K 4.7   < > 4.9  CL 115*   < > 105  CO2 26   < > 26  GLUCOSE 92   < > 127*  BUN 38*   < > 54*  CREATININE 2.40*   < > 2.93*  CALCIUM 7.1*   < > 8.0*  MG 2.2  --   --    < > = values in this interval not displayed.    ------------------------------------------------------------------------------------------------------------------  Cardiac Enzymes No results for input(s): TROPONINI in the last 168 hours. ------------------------------------------------------------------------------------------------------------------  RADIOLOGY:  Dg Chest 1 View  Result Date: 01/09/2017 CLINICAL DATA:  Status post left thoracentesis EXAM: CHEST 1 VIEW COMPARISON:  Film from earlier in the same day FINDINGS: Significant reduction in left-sided pleural effusion is noted. Some persistent bibasilar atelectasis and mild right-sided effusion are seen. Postsurgical changes are again noted. The cardiac shadow remains enlarged. No pneumothorax is seen. IMPRESSION: No pneumothorax following left thoracentesis. Electronically Signed   By: Alcide CleverMark  Lukens M.D.   On: 01/09/2017 15:05   [place] Thoracentesis Asp Pleural Space W/img Guide  Result Date: 01/09/2017 INDICATION: Left-sided pleural effusion EXAM: ULTRASOUND GUIDED LEFT THORACENTESIS MEDICATIONS: None. COMPLICATIONS: None immediate. PROCEDURE: An ultrasound guided thoracentesis was thoroughly discussed with the patient's family and questions answered. The benefits, risks, alternatives and complications were also discussed. The patient's family understands and wishes to proceed with the procedure. Written consent was obtained. Ultrasound was performed to localize and mark an adequate pocket of fluid in the left chest. The area was then prepped and draped in the normal sterile fashion. 1% Lidocaine was used for local anesthesia. Under ultrasound guidance a 6 Fr Safe-T-Centesis catheter was introduced. Thoracentesis was performed. The catheter was removed and a dressing applied. FINDINGS: A total of approximately 950 mL of blood tinged fluid was removed. IMPRESSION: Successful ultrasound guided left thoracentesis yielding 950 mL of pleural fluid. Electronically Signed  By: Alcide CleverMark  Lukens M.D.    On: 01/09/2017 15:04    ASSESSMENT AND PLAN:   Active Problems:   Large pleural effusion  83 year old male with a known history of coronary artery disease status post CABG, dementia, hypertension is being admitted for hypoxia there is a large left pleural effusion, ascites and acute renal failure  *Acute hypoxic respiratory failure with hypoxia and hypercapnia  Now comfort care Oxygen for supportive care Add scopolamine for secretions I have discussed the case with patient's family due to agitation last night and respiratory difficulties we have decided to start patient on a morphine drip   *Acute on chronic kidney disease stage III Supportive care  *Hyperkalemia No further workup because patient is comfort care  *Sinus bradycardia Patient is asymptomatic.  Comfort care  * Mixed Dementia, Vascular Dementia and Alzheimer's Disease:   Patient's medications discontinued due to comfort care  * History of stroke -all meds discontinued due to comfort care   Disposition based on patient's clinical course may need to go to hospice home    All the records are reviewed and case discussed with Care Management/Social Workerr. Management plans discussed with the patient, daughter , wife at bedside and they are in agreement.  CODE STATUS: DNR  TOTAL Critical care TIME TAKING CARE OF THIS PATIENT: 25 minutes.     POSSIBLE D/C IN ?DAYS, DEPENDING ON CLINICAL CONDITION.   [name], Quante Pettry M.D on 01/15/2017   Between 7am to 6pm - Pager - [phone]   After 6pm go to [email or web address] - password [email or web address]  [facility] [facility]  Office  [phone]  CC: Primary care physician; Corky DownsMasoud, Javed, MD  Note: This dictation was prepared with Dragon dictation along with smaller phrase technology. Any transcriptional errors that result from this process are unintentional.

## 2017-02-02 DEATH — deceased

## 2017-02-05 LAB — FUNGUS CULTURE WITH STAIN

## 2017-02-05 LAB — FUNGAL ORGANISM REFLEX

## 2017-02-05 LAB — FUNGUS CULTURE RESULT

## 2017-02-17 LAB — ACID FAST CULTURE WITH REFLEXED SENSITIVITIES: ACID FAST CULTURE - AFSCU3: NEGATIVE

## 2017-02-17 LAB — ACID FAST CULTURE WITH REFLEXED SENSITIVITIES (MYCOBACTERIA)

## 2018-12-04 IMAGING — CT CT HEAD W/O CM
3 series · 15 of 47 positions shown, 18 images · non-contrast
Comparison: Head CT scan 11/02/2016.

CLINICAL DATA: Status post fall x3 over the past week.

EXAM:
CT HEAD WITHOUT CONTRAST
TECHNIQUE: Contiguous axial images were obtained from the base of the skull
through the vertex without intravenous contrast.

[Series 2: head wo · axial · 0.49mm/px · z∈[+390,+515]mm · 9 of 31 slices shown, 12 images]
[im 3/31  brain]
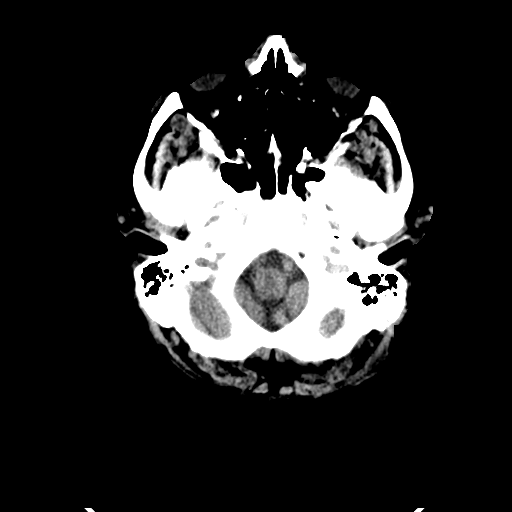
[im 3/31  bone]
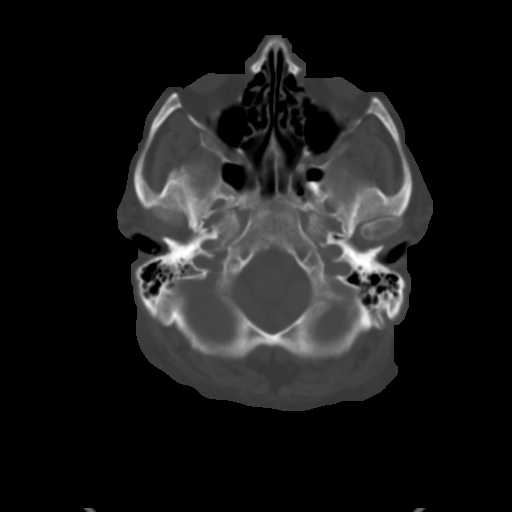
[im 6/31  brain]
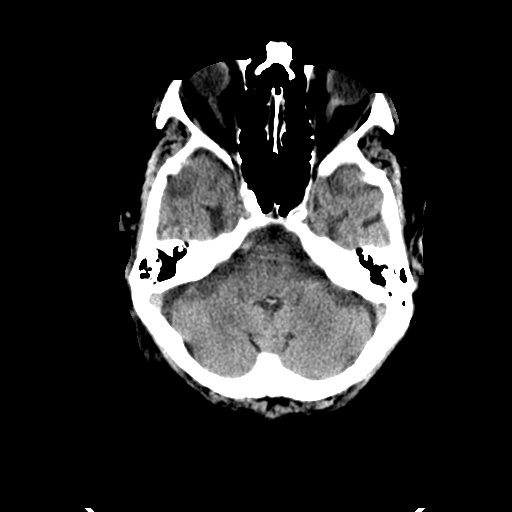
[im 9/31  brain]
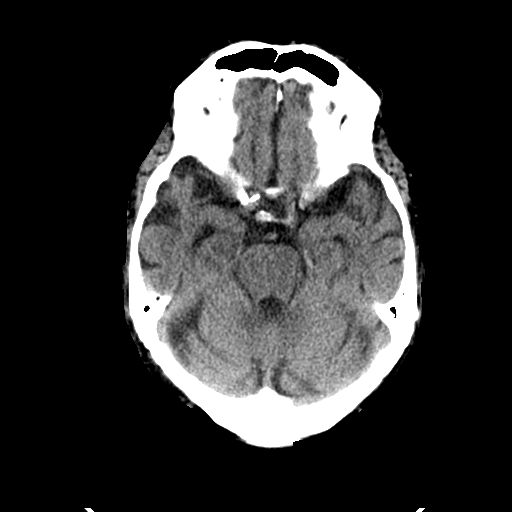
[im 12/31  brain]
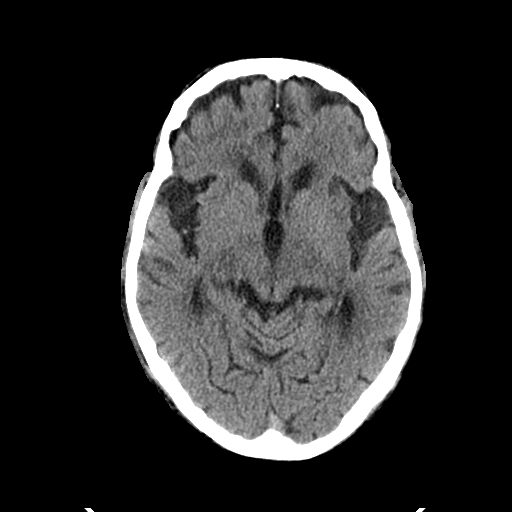
[im 16/31  brain]
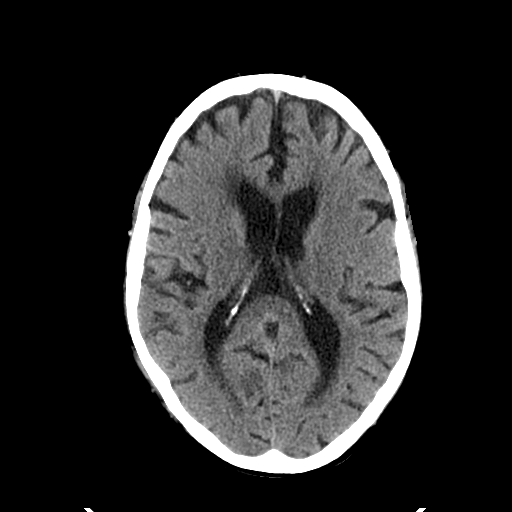
[im 16/31  bone]
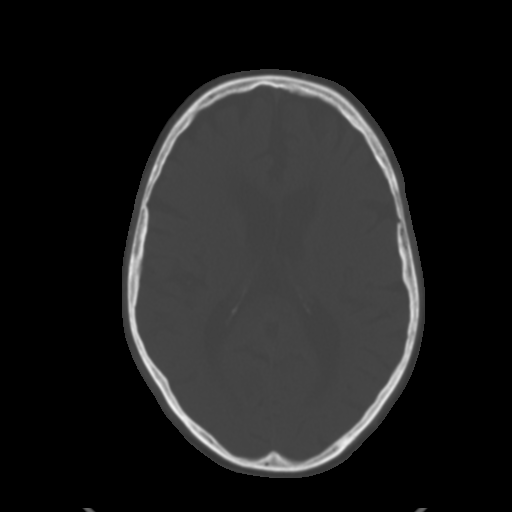
[im 19/31  brain]
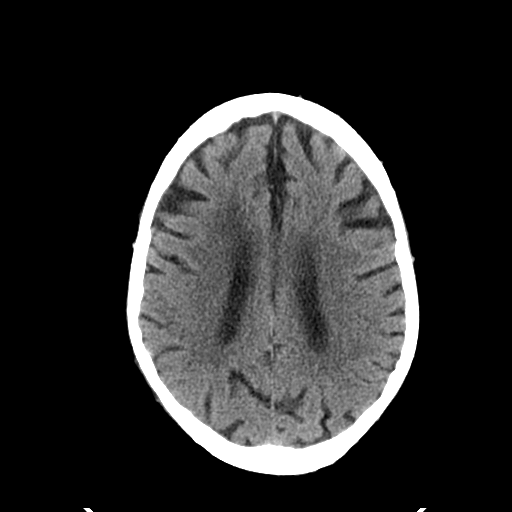
[im 22/31  brain]
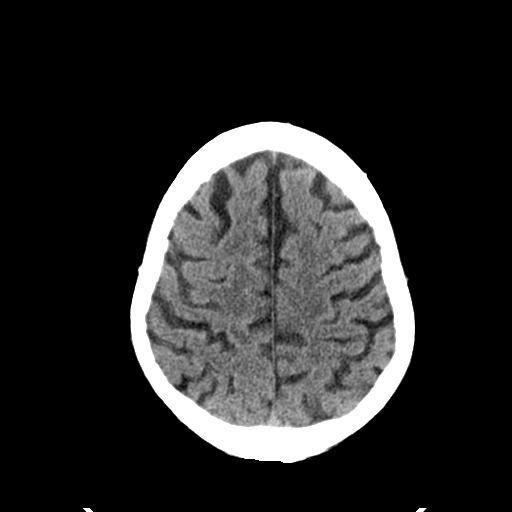
[im 25/31  brain]
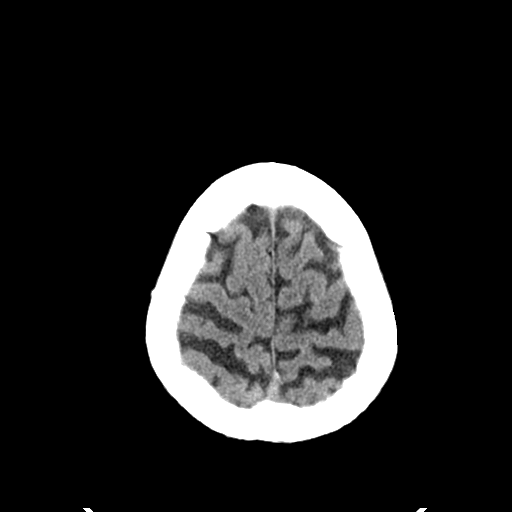
[im 28/31  brain]
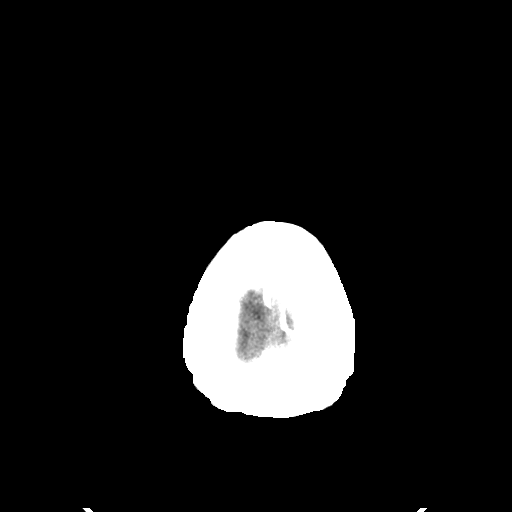
[im 28/31  bone]
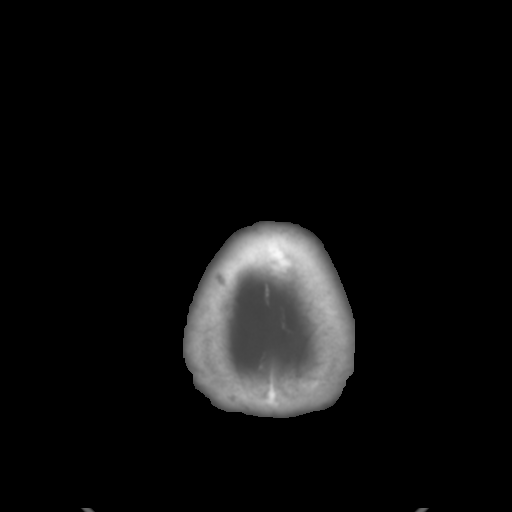

[Series 4: coronal soft tissue · coronal · 0.29mm/px · 3 of 67 slices shown]
[im 23/67  brain]
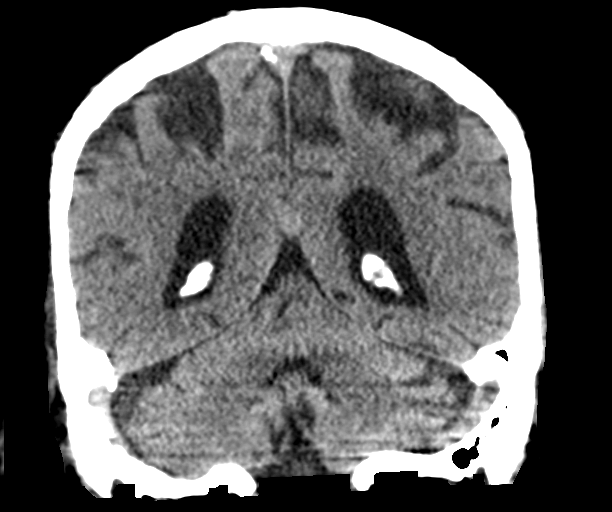
[im 30/67  brain]
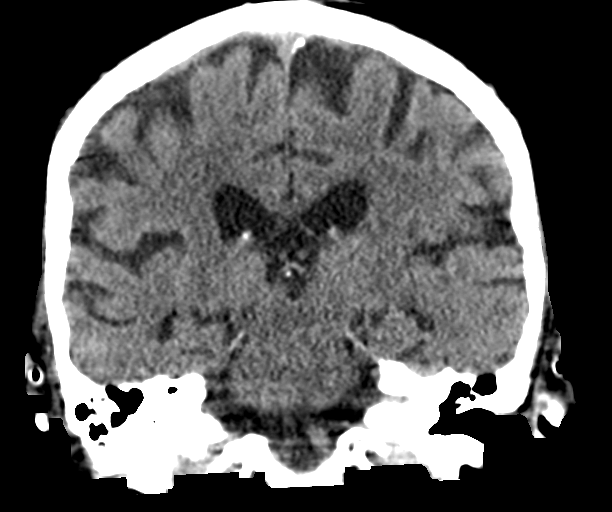
[im 37/67  brain]
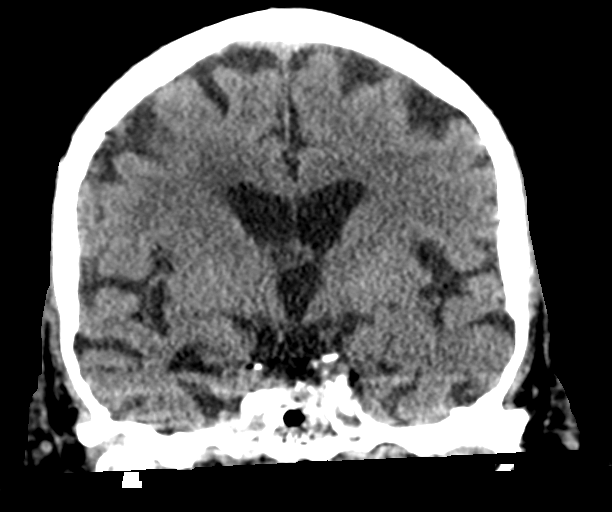

[Series 5: sagittal soft tissue · sagittal · 0.31mm/px · 3 of 50 slices shown]
[im 17/50  brain]
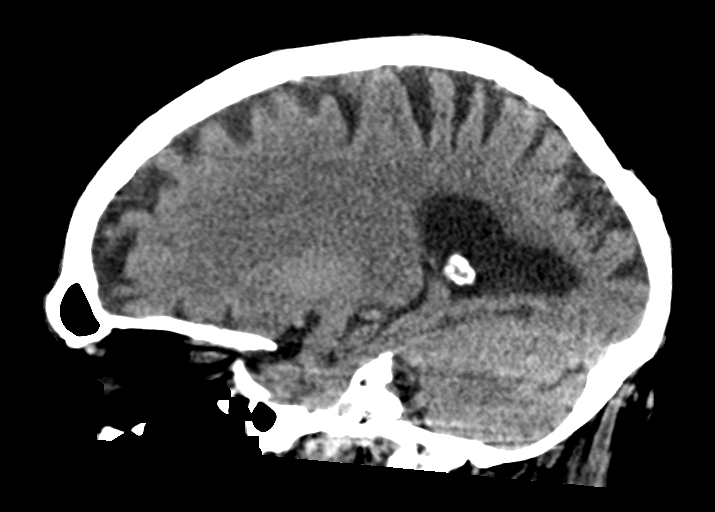
[im 25/50  brain]
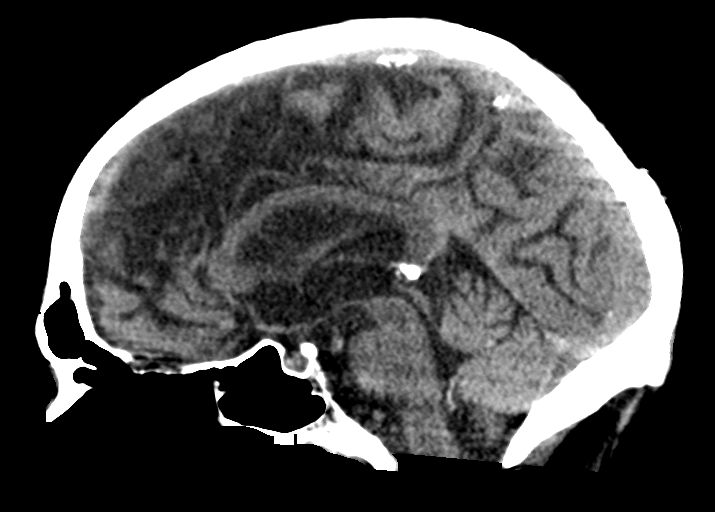
[im 33/50  brain]
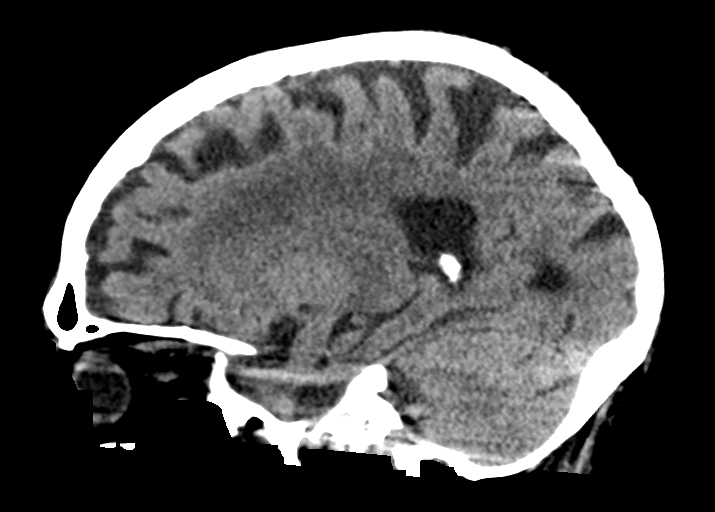

[15 of 47 positions shown; findings below may reference images not displayed]

FINDINGS: Brain: Cortical atrophy and chronic microvascular ischemic change
are again seen. No evidence of acute abnormality including
hemorrhage, infarct, mass lesion, mass effect, midline shift or
abnormal extra-axial fluid collection. No hydrocephalus or
pneumocephalus.

Vascular: Extensive atherosclerosis noted.

Skull: Intact.

Sinuses/Orbits: Negative.

Other: None.
IMPRESSION: No acute abnormality.

Atrophy and chronic microvascular ischemic change.

Extensive atherosclerosis.

## 2018-12-05 IMAGING — DX DG CHEST 1V
1 series · 1 of 1 positions shown · non-contrast
Comparison: 01/04/2017

CLINICAL DATA: Hypoxia per request. Thoracentesis yesterday left
lung

EXAM:
CHEST 1 VIEW

[chest ap]
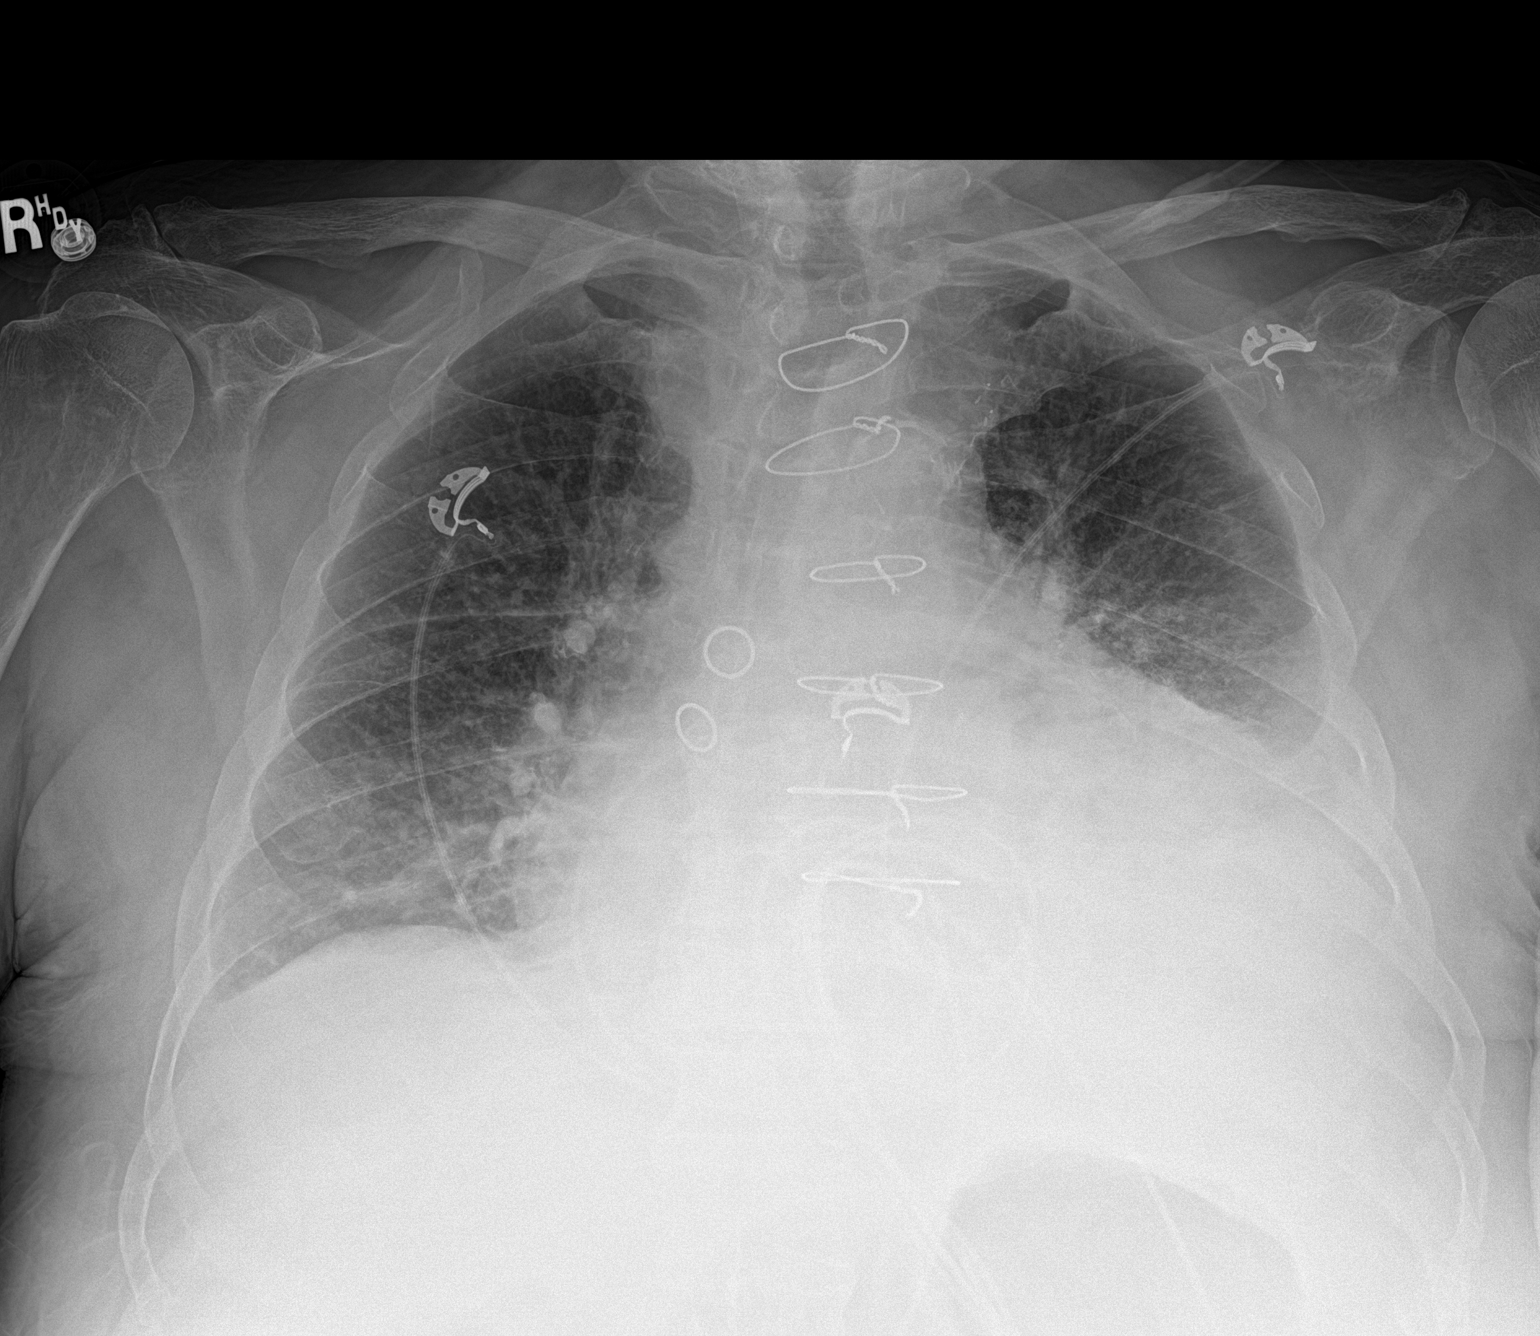

[1 of 1 positions shown; findings below may reference images not displayed]

FINDINGS: Lung volumes remain low. There is lung base opacity more evident on
the left, likely atelectasis. There probable small pleural
effusions.

No evidence of pulmonary edema.

Cardiac silhouette is mildly enlarged. Stable changes from CABG
surgery.

No pneumothorax.
IMPRESSION: 1. No significant change from the most recent prior study.
2. No pneumothorax.
3. Probable small effusions with left greater than right lung base
opacity, likely atelectasis. No convincing pulmonary edema.
